# Patient Record
Sex: Female | Born: 1935 | Race: Black or African American | Hispanic: No | Smoking: Never smoker
Health system: Southern US, Community
[De-identification: ages and names within clinical notes are randomized; demographics above are authoritative.]

## PROBLEM LIST (undated history)

## (undated) DIAGNOSIS — E785 Hyperlipidemia, unspecified: Secondary | ICD-10-CM

## (undated) DIAGNOSIS — I10 Essential (primary) hypertension: Secondary | ICD-10-CM

## (undated) DIAGNOSIS — F319 Bipolar disorder, unspecified: Secondary | ICD-10-CM

## (undated) DIAGNOSIS — F0390 Unspecified dementia without behavioral disturbance: Secondary | ICD-10-CM

---

## 2009-11-20 ENCOUNTER — Emergency Department: Payer: Self-pay | Admitting: Internal Medicine

## 2011-03-14 ENCOUNTER — Emergency Department (HOSPITAL_COMMUNITY)
Admission: EM | Admit: 2011-03-14 | Discharge: 2011-03-14 | Disposition: A | Discharge status: Home / Self Care | Payer: BC Managed Care – PPO | Attending: Emergency Medicine | Admitting: Emergency Medicine

## 2011-03-14 DIAGNOSIS — IMO0002 Reserved for concepts with insufficient information to code with codable children: Secondary | ICD-10-CM | POA: Insufficient documentation

## 2011-03-14 DIAGNOSIS — N390 Urinary tract infection, site not specified: Secondary | ICD-10-CM | POA: Insufficient documentation

## 2011-03-14 DIAGNOSIS — F0390 Unspecified dementia without behavioral disturbance: Secondary | ICD-10-CM | POA: Insufficient documentation

## 2011-03-14 DIAGNOSIS — R509 Fever, unspecified: Secondary | ICD-10-CM | POA: Insufficient documentation

## 2011-03-14 LAB — DIFFERENTIAL
Basophils Absolute: 0 10*3/uL (ref 0.0–0.1)
Lymphocytes Relative: 27 % (ref 12–46)
Monocytes Absolute: 1.8 10*3/uL — ABNORMAL HIGH (ref 0.1–1.0)
Neutro Abs: 3.7 10*3/uL (ref 1.7–7.7)

## 2011-03-14 LAB — URINALYSIS, ROUTINE W REFLEX MICROSCOPIC
Bilirubin Urine: NEGATIVE
Nitrite: POSITIVE — AB
pH: 7.5 (ref 5.0–8.0)

## 2011-03-14 LAB — BASIC METABOLIC PANEL
BUN: 7 mg/dL (ref 6–23)
GFR calc non Af Amer: >60 mL/min (ref >60)
Glucose, Bld: 131 mg/dL — ABNORMAL HIGH (ref 70–99)
Potassium: 4.4 mEq/L (ref 3.5–5.1)

## 2011-03-14 LAB — CBC
HCT: 32.9 % — ABNORMAL LOW (ref 36.0–46.0)
Hemoglobin: 11.2 g/dL — ABNORMAL LOW (ref 12.0–15.0)
RBC: 4.06 MIL/uL (ref 3.87–5.11)
WBC: 7.6 10*3/uL (ref 4.0–10.5)

## 2011-03-14 LAB — URINE MICROSCOPIC-ADD ON

## 2011-03-16 LAB — URINE CULTURE
Colony Count: >=100000
Culture  Setup Time: 201205310108

## 2011-04-30 ENCOUNTER — Encounter: Payer: Self-pay | Admitting: *Deleted

## 2011-04-30 ENCOUNTER — Inpatient Hospital Stay (HOSPITAL_COMMUNITY)
Admission: EM | Admit: 2011-04-30 | Discharge: 2011-05-04 | DRG: 690 | Disposition: A | Discharge status: Nursing Home (Includes ICF) | Payer: Medicare (Managed Care) | Attending: Internal Medicine | Admitting: Internal Medicine

## 2011-04-30 ENCOUNTER — Other Ambulatory Visit: Payer: Self-pay

## 2011-04-30 DIAGNOSIS — R627 Adult failure to thrive: Secondary | ICD-10-CM | POA: Diagnosis present

## 2011-04-30 DIAGNOSIS — F319 Bipolar disorder, unspecified: Secondary | ICD-10-CM | POA: Diagnosis present

## 2011-04-30 DIAGNOSIS — E870 Hyperosmolality and hypernatremia: Secondary | ICD-10-CM | POA: Diagnosis present

## 2011-04-30 DIAGNOSIS — R410 Disorientation, unspecified: Secondary | ICD-10-CM | POA: Diagnosis present

## 2011-04-30 DIAGNOSIS — F05 Delirium due to known physiological condition: Secondary | ICD-10-CM | POA: Diagnosis not present

## 2011-04-30 DIAGNOSIS — F0390 Unspecified dementia without behavioral disturbance: Secondary | ICD-10-CM | POA: Diagnosis present

## 2011-04-30 DIAGNOSIS — N390 Urinary tract infection, site not specified: Secondary | ICD-10-CM | POA: Diagnosis present

## 2011-04-30 DIAGNOSIS — E860 Dehydration: Secondary | ICD-10-CM

## 2011-04-30 DIAGNOSIS — I10 Essential (primary) hypertension: Secondary | ICD-10-CM | POA: Diagnosis present

## 2011-04-30 DIAGNOSIS — E876 Hypokalemia: Secondary | ICD-10-CM | POA: Diagnosis not present

## 2011-04-30 DIAGNOSIS — E785 Hyperlipidemia, unspecified: Secondary | ICD-10-CM | POA: Diagnosis present

## 2011-04-30 HISTORY — DX: Bipolar disorder, unspecified: F31.9

## 2011-04-30 HISTORY — DX: Essential (primary) hypertension: I10

## 2011-04-30 HISTORY — DX: Unspecified dementia, unspecified severity, without behavioral disturbance, psychotic disturbance, mood disturbance, and anxiety: F03.90

## 2011-04-30 HISTORY — DX: Hyperlipidemia, unspecified: E78.5

## 2011-04-30 MED ORDER — SODIUM CHLORIDE 0.9 % IV BOLUS (SEPSIS)
750.0000 mL | Freq: Once | INTRAVENOUS | Status: AC
  Administered 2011-05-01: 750 mL via INTRAVENOUS

## 2011-04-30 MED ORDER — SODIUM CHLORIDE 0.9 % IV SOLN: Freq: Once | INTRAVENOUS | Status: DC

## 2011-04-30 NOTE — ED Provider Notes (Addendum)
History     Chief Complaint  Patient presents with  . Fatigue  . Chest Pain    lt. rib pain  . Hematuria   HPI Comments: Per caregiver, pt seen by PCP 3 days ago for fever x 2 days as well as decreased appetite and general weakness. Caregiver also states pt has lost 20lb in past 2 months. Pt started on levaquin 3 days ago for possible UTI. Family noticed hematuria later that day. Pt has been c/o left lower anterior rib pain, but no recent fall or injury. +hoarse but no cough or sob.  Patient is a 77 y.o. female presenting with hematuria. The history is provided by a caregiver and a relative.  Hematuria This is a new problem. The current episode started in the past 7 days. The problem is unchanged. Associated symptoms include fever. Pertinent negatives include no abdominal pain, nausea or vomiting.    Past Medical History  Diagnosis Date  . Dementia   . Bipolar 1 disorder   . Hyperlipemia   . Hypertension     History reviewed. No pertinent past surgical history.  History reviewed. No pertinent family history.  History  Substance Use Topics  . Smoking status: Not on file  . Smokeless tobacco: Not on file  . Alcohol Use: No    OB History    Grav Para Term Preterm Abortions TAB SAB Ect Mult Living                  Review of Systems  Constitutional: Positive for fever.  Gastrointestinal: Negative for nausea, vomiting and abdominal pain.  Genitourinary: Positive for hematuria.  All other systems reviewed and are negative.    Physical Exam  BP 135/48  Pulse 43  Temp(Src) 98.5 F (36.9 C) (Oral)  Resp 14  Ht 5\' 9"  (1.753 m)  Wt 127 lb (57.607 kg)  BMI 18.75 kg/m2  SpO2 97%  Physical Exam  Constitutional: She is oriented to person, place, and time. She appears well-developed and well-nourished.  Non-toxic appearance. She does not appear ill. No distress.  HENT:  Head: Normocephalic and atraumatic.  Right Ear: External ear normal.  Left Ear: External ear  normal.  Nose: Nose normal. No mucosal edema or rhinorrhea.  Mouth/Throat: Mucous membranes are normal.       Pt refuses to open her mouth  Eyes: Conjunctivae and EOM are normal. Pupils are equal, round, and reactive to light.  Neck: Normal range of motion and full passive range of motion without pain. Neck supple.  Cardiovascular: Normal rate, regular rhythm and normal heart sounds.  Exam reveals no gallop and no friction rub.   No murmur heard. Pulmonary/Chest: Effort normal and breath sounds normal. No respiratory distress. She has no wheezes. She has no rhonchi. She has no rales. She exhibits no tenderness and no crepitus.  Abdominal: Soft. Normal appearance and bowel sounds are normal. She exhibits no distension. There is no tenderness. There is no rebound and no guarding.  Musculoskeletal: Normal range of motion. She exhibits no edema and no tenderness.       Moves all extremities well.   Neurological: She is alert and oriented to person, place, and time. She has normal strength. No cranial nerve deficit.  Skin: Skin is warm, dry and intact. No rash noted. No erythema. No pallor.  Psychiatric: Her affect is angry. She is withdrawn.       Pt refuses to talk, glares at me and her family (upset about getting urine  catheter done).    ED Course  Procedures Written by Enos Fling acting as scribe for Dr. Lynelle Doctor. I personally performed the services described in this documentation, which was scribed in my presence. The recorded information has been reviewed and considered. Devoria Albe, MD, FACEP I personally performed the services described in this documentation, which was scribed in my presence. The recorded information has been reviewed and considered.Devoria Albe, MD, FACEP MDM   Date: 05/01/2011  Rate: 90 Rhythm: normal sinus rhythm and premature ventricular contractions (PVC)  QRS Axis: normal  Intervals: normal  ST/T Wave abnormalities: normal  Conduction Disutrbances:none  Narrative  Interpretation:   Old EKG Reviewed: none available  Pt given IV fluids.   Results for orders placed during the hospital encounter of 04/30/11  CARDIAC PANEL(CRET KIN+CKTOT+MB+TROPI)      Component Value Range   Total CK 46  7 - 177 (U/L)   CK, MB 1.2  0.3 - 4.0 (ng/mL)   Troponin I <0.30  <0.30 (ng/mL)   Relative Index RELATIVE INDEX IS INVALID  0.0 - 2.5   CBC      Component Value Range   WBC 10.2  4.0 - 10.5 (K/uL)   RBC 4.18  3.87 - 5.11 (MIL/uL)   Hemoglobin 11.5 (*) 12.0 - 15.0 (g/dL)   HCT 04.5 (*) 40.9 - 46.0 (%)   MCV 82.1  78.0 - 100.0 (fL)   MCH 27.5  26.0 - 34.0 (pg)   MCHC 33.5  30.0 - 36.0 (g/dL)   RDW 81.1  91.4 - 78.2 (%)   Platelets 299  150 - 400 (K/uL)  BASIC METABOLIC PANEL      Component Value Range   Sodium 150 (*) 135 - 145 (mEq/L)   Potassium 3.9  3.5 - 5.1 (mEq/L)   Chloride 110  96 - 112 (mEq/L)   CO2 28  19 - 32 (mEq/L)   Glucose, Bld 165 (*) 70 - 99 (mg/dL)   BUN 21  6 - 23 (mg/dL)   Creatinine, Ser 9.56  0.50 - 1.10 (mg/dL)   Calcium 21.3 (*) 8.4 - 10.5 (mg/dL)   GFR calc non Af Amer >60  >60 (mL/min)   GFR calc Af Amer >60  >60 (mL/min)  URINALYSIS, ROUTINE W REFLEX MICROSCOPIC      Component Value Range   Color, Urine AMBER (*) YELLOW    Appearance HAZY (*) CLEAR    Specific Gravity, Urine >1.030 (*) 1.005 - 1.030    pH 5.5  5.0 - 8.0    Glucose, UA 100 (*) NEGATIVE (mg/dL)   Hgb urine dipstick SMALL (*) NEGATIVE    Bilirubin Urine SMALL (*) NEGATIVE    Ketones, ur NEGATIVE  NEGATIVE (mg/dL)   Protein, ur NEGATIVE  NEGATIVE (mg/dL)   Urobilinogen, UA 1.0  0.0 - 1.0 (mg/dL)   Nitrite NEGATIVE  NEGATIVE    Leukocytes, UA NEGATIVE  NEGATIVE   URINE MICROSCOPIC-ADD ON      Component Value Range   Squamous Epithelial / LPF FEW (*) RARE    WBC, UA 0-2  <3 (WBC/hpf)   RBC / HPF 7-10  <3 (RBC/hpf)   Bacteria, UA MANY (*) RARE    Casts HYALINE CASTS (*) NEGATIVE   Dg Abd Acute W/chest  05/01/2011  *RADIOLOGY REPORT*  Clinical Data:  77 year old female fever, constipation, lethargy.  ACUTE ABDOMEN SERIES (ABDOMEN 2 VIEW & CHEST 1 VIEW)  Comparison: None.  Findings: AP seated upright view the chest.  Low lung volumes. Cardiac size  and mediastinal contours are within normal limits.  No pneumothorax, pulmonary edema, pleural effusion, consolidation or pneumoperitoneum.  Degenerative changes in the spine with scoliosis. Nonobstructed bowel gas pattern. No acute osseous abnormality identified.  IMPRESSION: Nonobstructed bowel gas pattern, no free air. Low lung volumes, otherwise no acute cardiopulmonary abnormality.  Original Report Authenticated By: Harley Hallmark, M.D.           Ward Givens, MD 05/01/11 1610  Ward Givens, MD 05/01/11 [phone]

## 2011-04-30 NOTE — ED Notes (Signed)
Patient with left rib pain, patient has PCP PA on Friday-blood work done and antibiotics started, pt not eating or drinking per caregivier and has become weaker, hoarse voice, fever last week, denies N/V/D, per caregiver pt lost 20 lbs since May 2012, + blood in urine per caregiver

## 2011-05-01 ENCOUNTER — Encounter (HOSPITAL_COMMUNITY): Payer: Self-pay | Admitting: *Deleted

## 2011-05-01 ENCOUNTER — Emergency Department (HOSPITAL_COMMUNITY): Payer: Medicare (Managed Care)

## 2011-05-01 DIAGNOSIS — R627 Adult failure to thrive: Secondary | ICD-10-CM | POA: Diagnosis present

## 2011-05-01 DIAGNOSIS — F0390 Unspecified dementia without behavioral disturbance: Secondary | ICD-10-CM | POA: Diagnosis present

## 2011-05-01 DIAGNOSIS — N390 Urinary tract infection, site not specified: Secondary | ICD-10-CM | POA: Diagnosis present

## 2011-05-01 DIAGNOSIS — E870 Hyperosmolality and hypernatremia: Secondary | ICD-10-CM | POA: Diagnosis present

## 2011-05-01 DIAGNOSIS — E785 Hyperlipidemia, unspecified: Secondary | ICD-10-CM | POA: Diagnosis present

## 2011-05-01 DIAGNOSIS — I10 Essential (primary) hypertension: Secondary | ICD-10-CM | POA: Diagnosis present

## 2011-05-01 DIAGNOSIS — F319 Bipolar disorder, unspecified: Secondary | ICD-10-CM | POA: Diagnosis present

## 2011-05-01 LAB — URINALYSIS, ROUTINE W REFLEX MICROSCOPIC
Glucose, UA: 100 mg/dL — AB
Protein, ur: NEGATIVE mg/dL
pH: 5.5 (ref 5.0–8.0)

## 2011-05-01 LAB — URINE MICROSCOPIC-ADD ON

## 2011-05-01 LAB — COMPREHENSIVE METABOLIC PANEL
ALT: 74 U/L — ABNORMAL HIGH (ref 0–35)
AST: 60 U/L — ABNORMAL HIGH (ref 0–37)
Albumin: 2.7 g/dL — ABNORMAL LOW (ref 3.5–5.2)
CO2: 28 mEq/L (ref 19–32)
Calcium: 9.9 mg/dL (ref 8.4–10.5)
GFR calc non Af Amer: >60 mL/min (ref >60)
Sodium: 148 mEq/L — ABNORMAL HIGH (ref 135–145)

## 2011-05-01 LAB — DIFFERENTIAL
Basophils Absolute: 0 10*3/uL (ref 0.0–0.1)
Basophils Relative: 0 % (ref 0–1)
Eosinophils Relative: 1 % (ref 0–5)
Lymphocytes Relative: 15 % (ref 12–46)
Neutro Abs: 6.2 10*3/uL (ref 1.7–7.7)

## 2011-05-01 LAB — CBC
Hemoglobin: 11.5 g/dL — ABNORMAL LOW (ref 12.0–15.0)
MCH: 27.5 pg (ref 26.0–34.0)
MCV: 82.1 fL (ref 78.0–100.0)
Platelets: 256 10*3/uL (ref 150–400)
RBC: 4.18 MIL/uL (ref 3.87–5.11)
RDW: 15.3 % (ref 11.5–15.5)
WBC: 8 10*3/uL (ref 4.0–10.5)

## 2011-05-01 LAB — MAGNESIUM: Magnesium: 2.2 mg/dL (ref 1.5–2.5)

## 2011-05-01 LAB — BASIC METABOLIC PANEL
CO2: 28 mEq/L (ref 19–32)
Calcium: 10.6 mg/dL — ABNORMAL HIGH (ref 8.4–10.5)
Chloride: 110 mEq/L (ref 96–112)
Creatinine, Ser: 0.79 mg/dL (ref 0.50–1.10)
Glucose, Bld: 165 mg/dL — ABNORMAL HIGH (ref 70–99)
Sodium: 150 mEq/L — ABNORMAL HIGH (ref 135–145)

## 2011-05-01 LAB — MRSA PCR SCREENING: MRSA by PCR: NEGATIVE

## 2011-05-01 MED ORDER — PANTOPRAZOLE SODIUM 40 MG IV SOLR
40.0000 mg | INTRAVENOUS | Status: DC
  Administered 2011-05-01: 40 mg via INTRAVENOUS
  Filled 2011-05-01: qty 40

## 2011-05-01 MED ORDER — ENSURE CLINICAL ST REVIGOR PO LIQD
237.0000 mL | Freq: Three times a day (TID) | ORAL | Status: DC
  Administered 2011-05-01 – 2011-05-03 (×6): 237 mL via ORAL

## 2011-05-01 MED ORDER — SIMVASTATIN 20 MG PO TABS
20.0000 mg | ORAL_TABLET | Freq: Every day | ORAL | Status: DC
  Administered 2011-05-02: 20 mg via ORAL
  Filled 2011-05-01 (×2): qty 1

## 2011-05-01 MED ORDER — ALPRAZOLAM 0.5 MG PO TABS
0.5000 mg | ORAL_TABLET | Freq: Two times a day (BID) | ORAL | Status: DC
  Administered 2011-05-01 – 2011-05-02 (×3): 0.5 mg via ORAL
  Filled 2011-05-01 (×4): qty 1

## 2011-05-01 MED ORDER — CEFTRIAXONE SODIUM 1 G IJ SOLR
1.0000 g | INTRAMUSCULAR | Status: DC
  Filled 2011-05-01 (×2): qty 1

## 2011-05-01 MED ORDER — SODIUM CHLORIDE 0.9 % IJ SOLN
INTRAMUSCULAR | Status: AC
  Administered 2011-05-01: 10 mL
  Filled 2011-05-01: qty 10

## 2011-05-01 MED ORDER — DEXTROSE 5 % IV SOLN
INTRAVENOUS | Status: DC
  Administered 2011-05-01 – 2011-05-03 (×2): via INTRAVENOUS

## 2011-05-01 MED ORDER — CIPROFLOXACIN IN D5W 400 MG/200ML IV SOLN
INTRAVENOUS | Status: AC
  Filled 2011-05-01: qty 200

## 2011-05-01 MED ORDER — MEMANTINE HCL 10 MG PO TABS
10.0000 mg | ORAL_TABLET | Freq: Two times a day (BID) | ORAL | Status: DC
  Administered 2011-05-01 – 2011-05-02 (×3): 10 mg via ORAL
  Filled 2011-05-01 (×4): qty 1

## 2011-05-01 MED ORDER — ENOXAPARIN SODIUM 40 MG/0.4ML ~~LOC~~ SOLN
40.0000 mg | SUBCUTANEOUS | Status: DC
  Administered 2011-05-01 – 2011-05-02 (×2): 40 mg via SUBCUTANEOUS
  Filled 2011-05-01 (×2): qty 0.4

## 2011-05-01 MED ORDER — CIPROFLOXACIN IN D5W 400 MG/200ML IV SOLN
400.0000 mg | Freq: Two times a day (BID) | INTRAVENOUS | Status: DC
  Administered 2011-05-01: 400 mg via INTRAVENOUS
  Filled 2011-05-01: qty 200

## 2011-05-01 MED ORDER — MIRTAZAPINE 30 MG PO TABS
15.0000 mg | ORAL_TABLET | Freq: Every day | ORAL | Status: DC
  Administered 2011-05-02: 15 mg via ORAL
  Filled 2011-05-01 (×2): qty 1

## 2011-05-01 MED ORDER — AMLODIPINE BESYLATE 5 MG PO TABS
5.0000 mg | ORAL_TABLET | Freq: Every day | ORAL | Status: DC
  Administered 2011-05-01 – 2011-05-04 (×4): 5 mg via ORAL
  Filled 2011-05-01 (×4): qty 1

## 2011-05-01 MED ORDER — CEFTRIAXONE SODIUM 1 G IJ SOLR
1.0000 g | INTRAMUSCULAR | Status: DC
  Administered 2011-05-01 – 2011-05-03 (×3): 1 g via INTRAVENOUS
  Filled 2011-05-01 (×4): qty 1

## 2011-05-01 NOTE — Progress Notes (Signed)
Patient refused all 2200 medications. Patient also refuses to keep arm straight for IVF.

## 2011-05-01 NOTE — ED Notes (Addendum)
Note entered under incorrect patient removed

## 2011-05-01 NOTE — ED Notes (Signed)
Patient is resting comfortably. 

## 2011-05-01 NOTE — H&P (Signed)
  [id number] 

## 2011-05-01 NOTE — Progress Notes (Signed)
Subjective: Denies pain, nausea or other.  Objective: Vital signs in last 24 hours: Temp:  [98.5 F (36.9 C)] 98.5 F (36.9 C) (07/17 0459) Pulse Rate:  [43-94] 79  (07/17 0459) Resp:  [14-20] 15  (07/17 0500) BP: (114-143)/(48-77) 142/76 mmHg (07/17 0500) SpO2:  [97 %-98 %] 97 % (07/16 2314) Weight:  [57.6 kg (126 lb 15.8 oz)-57.607 kg (127 lb)] 126 lb 15.8 oz (57.6 kg) (07/17 0459) Weight change:  Last BM Date:  (unknown)  Intake/Output from previous day: 07/16 0701 - 07/17 0700 In: 800 [I.V.:800] Out: 10 [Urine:10] Intake/Output this shift:    General appearance: alert, cooperative, appears stated age, no distress and confused. tangential Head: Normocephalic, without obvious abnormality, atraumatic, moist MM Resp: clear to auscultation bilaterally Cardio: regular rate and rhythm, S1, S2 normal, no murmur, click, rub or gallop GI: soft, non-tender; bowel sounds normal; no masses,  no organomegaly Extremities: extremities normal, atraumatic, no cyanosis or edema  Lab Results:  Basename 05/01/11 0818 04/30/11 2342  WBC 8.0 10.2  HGB 10.7* 11.5*  HCT 32.7* 34.3*  PLT 256 299   BMET  Basename 04/30/11 2342  NA 150*  K 3.9  CL 110  CO2 28  GLUCOSE 165*  BUN 21  CREATININE 0.79  CALCIUM 10.6*    Assessment/Plan: Principal Problem:  *UTI (urinary tract infection): change abx to ceftriaxone Active Problems:  FTT (failure to thrive) in adult:  Add ensure  Hypernatremia: recheck   Dementia:  Appears advanced  Bipolar disorder: stable  HTN (hypertension), benign  Hyperlipemia   LOS: 1 day   Mico Spark L 05/01/2011, 9:31 AM

## 2011-05-01 NOTE — H&P (Signed)
Michelle Escobar, NARON NO.:  [id number]  MEDICAL RECORD NO.:  [id number]  LOCATION:  [facility]                         FACILITY:  [facility]  PHYSICIAN:  Talmage Nap, MD  DATE OF BIRTH:  [birth year]  DATE OF ADMISSION:  04/30/2011 DATE OF DISCHARGE:  LH                             HISTORY & PHYSICAL   PRIMARY CARE PHYSICIAN:  Unknown.  History obtainable mainly from ED physician and partly from the patient (not a very good historian).  CHIEF COMPLAINT:  Refusal of feeds in the nursing home of unspecified duration.  The patient is an 77 year old African American female with history of dementia, bipolar affective disorder and hypertension, who was brought to the emergency room from the nursing home because the patient was said to be refusing the feeds.  There was, however, no documented history of fever.  There was no documented history of chills.  No nausea or vomiting.  No chest pain or shortness of breath.  No abdominal discomfort and no diarrhea or hematochezia.  The refusal of feed was said to be getting persistent and subsequently the patient was brought to the emergency room to be evaluated.  PAST MEDICAL HISTORY: 1. Positive for dementia. 2. Bipolar affective disorder. 3. Hyperlipidemia. 4. Hypertension.  PAST SURGICAL HISTORY:  No known past surgical history.  Her preadmission meds include: 1. Alprazolam 0.5 mg 2 times daily. 2. Norvasc 5 mg p.o. daily. 3. Levaquin 250 mg p.o. daily. 4. Memantine (Namenda 10 mg 2 times daily). 5. Mirtazapine (Remeron 50 mg p.o. daily at bedtime). 6. Simvastatin (Zocor 20 mg p.o. daily at bedtime).  ALLERGIES:  She has no known allergies.  SOCIAL HISTORY:  Negative for alcohol or tobacco use.  She is a resident of the nursing home.  FAMILY HISTORY:  Unknown.  REVIEW OF SYSTEMS:  The patient denies any headaches.  No blurry vision. No nausea or vomiting.  No fever.  No chills.  No rigor.  No chest pain or  shortness of breath.  Complained about mild suprapubic discomfort. No diarrhea or hematochezia.  No dysuria or hematuria.  No swelling of the lower extremity.  No intolerance to heat or cold and no neuropsychiatric disorder.  PHYSICAL EXAMINATION:  GENERAL:  Elderly lady, dehydrated, confused, but not in any respiratory distress at present. VITAL SIGNS:  Temperature is 98.5, pulse is 82, respiratory rate is 14, blood pressure is 132/76 and saturating 97% on room air. HEENT:  Pallor, but pupils are reactive to light and extraocular muscles are intact. NECK:  No jugular venous distention.  No carotid bruit.  No lymphadenopathy. CHEST:  Clear to auscultation.  Heart sounds are 1 and 2. ABDOMEN:  Soft with vague tenderness in the suprapubic region.  Liver, spleen and kidney are not palpable.  Bowel sounds are positive. EXTREMITIES:  No pedal edema. NEUROLOGIC:  Nonfocal. NEUROPSYCHIATRIC EVALUATION:  The patient to be confused. MUSCULOSKELETAL SYSTEM:  Unremarkable. SKIN:  Decreased turgor.  LABORATORY DATA:  Chemistry showed sodium of 150, potassium of 3.9, chloride of 1100, bicarb is 28, BUN is 21, creatinine is 0.79 and glucose is 165.  First set of cardiac markers troponin-I less than 0.30. Hematological indices showed WBC of  10.2, hemoglobin 11.5, hematocrit of 34.3, MCV 82.1 and the platelet count of 299.  Urinalysis showed urine wbc 0-2, rbc 7-10 with many bacteria.  Imaging studies done on the patient include acute abdominal series with chest x-ray and it showed nonobstructive bowel gas pattern.  No free air seen and lung was essentially normal.  PROBLEM LIST: 1. Refusal of feeds. 2. Dehydration. 3. Hypernatremia. 4. Urinary tract infection. 5. Hypercalcemia.  IMPRESSION: 1. Urinary tract infection. 2. Dehydration. 3. Hypernatremia. 4. Hypercalcemia. 5. Dementia. 6. Bipolar affective disorder. 7. Hyperlipidemia. 8. Hypertension.  PLAN:  To admit the patient to  general medical floor.  The patient will be rehydrated with D5W to go at rate of 75 mL an hour.  She will be treated and this will also be used to manage hypernatremia.  UTI.  The patient will be on ciprofloxacin 400 mg IV q.12 h.  Blood pressure will be maintained with amlodipine 5 mg p.o. daily.  The patient will be restarted on her home meds which include; Namenda 10 mg p.o. daily, mirtazapine 50 mg p.o. daily and also Zocor 20 mg p.o. daily.  GI prophylaxis will be done with Protonix 40 mg IV q.24 and DVT prophylaxis with TED stockings as well as Lovenox 40 mg subcu q.24 h.  Further workup to be done on this patient will include intact PTH because of the hypercalcemia.  CBC, CMP and magnesium will be repeated in a.m.  The patient will be followed and evaluated on day-to-day basis.     Talmage Nap, MD     CN/MEDQ  D:  05/01/2011  T:  05/01/2011  Job:  [id number]

## 2011-05-01 NOTE — ED Notes (Signed)
Eyes open, MAE has strong grips.  Visitors say decreased intake for 1 week.  Seen by MD on Friday and lab work done.  MM's dry.

## 2011-05-02 ENCOUNTER — Encounter (HOSPITAL_COMMUNITY): Payer: Self-pay | Admitting: Family Medicine

## 2011-05-02 LAB — BASIC METABOLIC PANEL
BUN: 5 mg/dL — ABNORMAL LOW (ref 6–23)
CO2: 29 mEq/L (ref 19–32)
Chloride: 99 mEq/L (ref 96–112)
Creatinine, Ser: 0.62 mg/dL (ref 0.50–1.10)
GFR calc Af Amer: >60 mL/min (ref >60)
Glucose, Bld: 194 mg/dL — ABNORMAL HIGH (ref 70–99)

## 2011-05-02 LAB — PTH, INTACT AND CALCIUM: PTH: 59 pg/mL (ref 14.0–72.0)

## 2011-05-02 MED ORDER — MIRTAZAPINE 30 MG PO TABS
15.0000 mg | ORAL_TABLET | Freq: Every day | ORAL | Status: DC
  Administered 2011-05-03: 15 mg via ORAL
  Filled 2011-05-02: qty 1

## 2011-05-02 MED ORDER — MEMANTINE HCL 10 MG PO TABS
10.0000 mg | ORAL_TABLET | Freq: Two times a day (BID) | ORAL | Status: DC
  Administered 2011-05-03 – 2011-05-04 (×3): 10 mg via ORAL
  Filled 2011-05-02 (×3): qty 1

## 2011-05-02 MED ORDER — SIMVASTATIN 20 MG PO TABS
20.0000 mg | ORAL_TABLET | Freq: Every day | ORAL | Status: DC
  Administered 2011-05-03: 20 mg via ORAL
  Filled 2011-05-02: qty 1

## 2011-05-02 MED ORDER — ENOXAPARIN SODIUM 40 MG/0.4ML ~~LOC~~ SOLN
40.0000 mg | SUBCUTANEOUS | Status: DC
  Administered 2011-05-03: 40 mg via SUBCUTANEOUS
  Filled 2011-05-02: qty 0.4

## 2011-05-02 MED ORDER — HALOPERIDOL 2 MG PO TABS: 1.0000 mg | ORAL_TABLET | Freq: Four times a day (QID) | ORAL | Status: DC | PRN

## 2011-05-02 MED ORDER — HALOPERIDOL LACTATE 5 MG/ML IJ SOLN
5.0000 mg | Freq: Once | INTRAMUSCULAR | Status: AC
  Administered 2011-05-02: 5 mg via INTRAMUSCULAR
  Filled 2011-05-02: qty 1

## 2011-05-02 MED ORDER — ALPRAZOLAM 0.5 MG PO TABS
0.5000 mg | ORAL_TABLET | Freq: Two times a day (BID) | ORAL | Status: DC
  Administered 2011-05-03 – 2011-05-04 (×3): 0.5 mg via ORAL
  Filled 2011-05-02 (×3): qty 1

## 2011-05-02 MED ORDER — SODIUM CHLORIDE 0.9 % IJ SOLN
INTRAMUSCULAR | Status: AC
  Filled 2011-05-02: qty 3

## 2011-05-02 MED ORDER — HALOPERIDOL LACTATE 5 MG/ML IJ SOLN: 1.0000 mg | Freq: Four times a day (QID) | INTRAMUSCULAR | Status: DC | PRN

## 2011-05-02 MED ORDER — HALOPERIDOL DECANOATE 100 MG/ML IM SOLN
5.0000 mg | Freq: Once | INTRAMUSCULAR | Status: DC
  Filled 2011-05-02: qty 0.05

## 2011-05-02 NOTE — Progress Notes (Signed)
UR Chart Review Completed  

## 2011-05-02 NOTE — Progress Notes (Signed)
Per nursing, became very agitated overnight, getting OOB, tore out IV, refusing meds and refusing to eat. Now sedated after IM Haldol and safety sitter. Refused labs. Now has IV back in  Subjective: Asleep  Objective: Vital signs in last 24 hours: Temp:  [98 F (36.7 C)-98.5 F (36.9 C)] 98.1 F (36.7 C) (07/18 1400) Pulse Rate:  [81-102] 83  (07/18 1400) Resp:  [16-20] 16  (07/18 1400) BP: (138-157)/(74-88) 139/88 mmHg (07/18 1400) SpO2:  [93 %-99 %] 99 % (07/18 1400) Weight change:  Last BM Date: 05/01/11  Intake/Output from previous day: 07/17 0701 - 07/18 0700 In: 600 [I.V.:600] Out: 1 [Urine:1] Intake/Output this shift: I/O this shift: In: 120 [P.O.:120] Out: -   General appearance: sedated, arousable Resp: clear to auscultation bilaterally Cardio: regular rate and rhythm, S1, S2 normal, no murmur, click, rub or gallop GI: soft, non-tender; bowel sounds normal; no masses,  no organomegaly Extremities: extremities normal, atraumatic, no cyanosis or edema  Lab Results:  [facility] 05/01/11 0818 04/30/11 2342  WBC 8.0 10.2  HGB 10.7* 11.5*  HCT 32.7* 34.3*  PLT 256 299   BMET  Basename 05/01/11 0818 04/30/11 2342  NA 148* 150*  K 3.1* 3.9  CL 110 110  CO2 28 28  GLUCOSE 179* 165*  BUN 13 21  CREATININE 0.56 0.79  CALCIUM 9.19.9 10.6*    Assessment/Plan: Principal Problem:  *UTI (urinary tract infection): change abx to ceftriaxone. Culture not done.  Will order Active Problems:  FTT (failure to thrive) in adult:  Add ensure  Hypernatremia: get BMET now while cooperative   Dementia:  Appears advanced  Bipolar disorder: stable  HTN (hypertension), benign  Hyperlipemia Acute Delerium.  Will order PRN haldol   LOS: 2 days   Wilfredo Canterbury L 05/02/2011, 4:47 PM

## 2011-05-03 MED ORDER — POTASSIUM CHLORIDE 2 MEQ/ML IV SOLN
INTRAVENOUS | Status: DC
  Administered 2011-05-03: 13:00:00 via INTRAVENOUS
  Filled 2011-05-03 (×2): qty 1000

## 2011-05-03 NOTE — Progress Notes (Signed)
Patient's daughter Michelle Escobar @ [phone] would like a swallow test performed while she is in the hospital d/t a decrease in her eating habits and difficulty swallowing per family.  Left physicians sticky note on chart

## 2011-05-03 NOTE — Progress Notes (Signed)
Speech Language/Pathology Bedside Swallow Evaluation Patient Name: Michelle Escobar [id number] Date: 05/03/2011 Problem List:  Patient Active Problem List  Diagnoses  . UTI (urinary tract infection)  . Hypernatremia  . FTT (failure to thrive) in adult  . Dementia  . Bipolar disorder  . HTN (hypertension), benign  . Hyperlipemia   Past Medical History:  Past Medical History  Diagnosis Date  . Dementia   . Bipolar 1 disorder   . Hyperlipemia   . Hypertension    Past Surgical History: History reviewed. No pertinent past surgical history. General Information Date of Onset: 05/02/11 Type of Study:  (BSE) Diet Prior to this Study: Regular;Thin liquids Temperature Spikes Noted: No Respiratory Status: Room air Trach Size and Type:  (N/A) Behavior/Cognition: Alert;Uncooperative Patient Positioning: Upright in bed Baseline Vocal Quality: Normal     Consistency Results                 Assessment/Recommendations/Treatment Plan          Recommendations Solid Consistency: Regular Liquid Consistency: Thin Liquid Administration via: Cup Medication Administration: Crushed with puree Postural Changes and/or Swallow Maneuvers: Seated upright 90 degrees Oral Care Recommendations: Staff/trained caregiver to provide oral care Other Recommendations:  (encourage intake, use sweetening to increase intake of foods)     Prognosis Prognosis for Safe Diet Advancement: Good Barriers to Reach Goals: Cognitive deficits;Behavior     Bedside eval attempted x2, patient not cooperative   Spoke with nurse and sitter, report patient took 75% of breakfast meal with no s/s, no cough no change in breathing and good intake. Requested cheeseburger for lunch Poor apetitte appears due to behavior not dysphagia but patient not cooperative for full eval

## 2011-05-03 NOTE — Progress Notes (Signed)
NAMEANTWONETTE, FELIZ NO.:  [id number]  MEDICAL RECORD NO.:  [id number]  LOCATION:  A325                          FACILITY:  [facility]  PHYSICIAN:  Wilson Singer, M.D.DATE OF BIRTH:  05/03/1927  DATE OF PROCEDURE:  05/03/2011 DATE OF DISCHARGE:                                PROGRESS NOTE   This 77 year old demented lady is better today.  She is less agitated and appears to be less delirious than she was yesterday.  She has tolerated some food without any problems.  The daughter requested a swallowing evaluation, which I think is not unreasonable in view of her dementia.  PHYSICAL EXAMINATION:  VITAL SIGNS:  Temperature 97.9, blood pressure 128/57, pulse 99, saturation 93% on room air. GENERAL:  She looks systemically well and still appears to be slightly delirious. HEART:  Sounds are present, normal. LUNG:  Fields are clear. ABDOMEN:  Soft and nontender. NEUROLOGIC:  There are no focal neurological signs.  INVESTIGATIONS:  Sodium 139, potassium slightly low at 3.4, BUN 5, creatinine 0.62.  Her sodium is significantly improved to normal now. It was 150 on admission. Urine culture is not available.  IMPRESSION: 1. Urinary tract infection. 2. Hypernatremia, improved. 3. Dementia, advanced. 4. Bipolar disorder. 5. Hypertension.  PLAN: 1. Reduce intravenous fluids and replete potassium. 2. Speech Language Pathology consultation for swallowing evaluation. 3. Possible discharge soon if the patient continues to be stable.  We     will repeat her lab work tomorrow.    Wilson Singer, M.D.    NCG/MEDQ  D:  05/03/2011  T:  05/03/2011  Job:  [id number]

## 2011-05-04 DIAGNOSIS — R410 Disorientation, unspecified: Secondary | ICD-10-CM | POA: Diagnosis present

## 2011-05-04 LAB — COMPREHENSIVE METABOLIC PANEL
AST: 20 U/L (ref 0–37)
BUN: 3 mg/dL — ABNORMAL LOW (ref 6–23)
CO2: 28 mEq/L (ref 19–32)
Calcium: 9.2 mg/dL (ref 8.4–10.5)
Creatinine, Ser: 0.54 mg/dL (ref 0.50–1.10)
GFR calc Af Amer: >60 mL/min (ref >60)
GFR calc non Af Amer: >60 mL/min (ref >60)
Glucose, Bld: 134 mg/dL — ABNORMAL HIGH (ref 70–99)

## 2011-05-04 LAB — CBC
MCH: 27 pg (ref 26.0–34.0)
MCV: 79.4 fL (ref 78.0–100.0)
Platelets: 238 10*3/uL (ref 150–400)
RBC: 4.03 MIL/uL (ref 3.87–5.11)

## 2011-05-04 MED ORDER — HALOPERIDOL 1 MG PO TABS: 1.0000 mg | ORAL_TABLET | Freq: Four times a day (QID) | ORAL | Status: AC | PRN

## 2011-05-04 MED ORDER — NITROFURANTOIN MONOHYD MACRO 100 MG PO CAPS: 100.0000 mg | ORAL_CAPSULE | Freq: Two times a day (BID) | ORAL | Status: AC

## 2011-05-04 MED ORDER — ENSURE CLINICAL ST REVIGOR PO LIQD: 237.0000 mL | Freq: Three times a day (TID) | ORAL | Status: DC

## 2011-05-04 NOTE — Discharge Summary (Signed)
Physician Discharge Summary  Patient ID: Michelle Escobar MRN: [id number] DOB/AGE: Jan 15, 1927 77 y.o.  Admit date: 04/30/2011 Discharge date: 05/04/2011  Discharge Diagnoses:  Principal Problem:  *UTI (urinary tract infection) Active Problems:  FTT (failure to thrive) in adult  Hypernatremia  Dementia  Bipolar disorder  HTN (hypertension), benign  Hyperlipemia   Discharged Condition: Stable  Hospital Course: The patient is an 77 year old black female who was admitted to the hospital for urinary tract infection dehydration, hypernatremia. She had been failing to thrive at the assisted living facility. She has a history of dementia and bipolar disorder. She is a poor historian. Please see H&P for complete admission details. She has been refusing to eat. In the emergency room, she had normal vital signs. She was confused but cooperative. She had an unremarkable physical examination. The patient was admitted to the hospitalist service. She was given D5W. She was started initially on ciprofloxacin, subsequently switched to ceftriaxone. She will require a few more days of oral antibiotic. Her height her hypernatremia has resolved. She has had no fevers. Her vital signs are stable. She is now eating intermittently. She is stable for discharge. She also had a speech therapy evaluation. She was uncooperative but the speech therapist noted no problems with aspiration based on nursing reports.  The patient also had hypokalemia which was corrected.  She also became acutely delirious, ripped out her IV and became very combative. She was given sedatives and an IV was replaced. She was provided a Recruitment consultant. At this time, she is currently calm and cooperative. She has intermittently refused vital signs, laboratories. She will most likely be more cooperative in a familiar environment and will be transferred back to assisted-living facility. Total time on the day of discharge is greater than 30  minutes.  Consults: Speech Therapy  Significant Diagnostic Studies:  Dg Abd Acute W/chest  05/01/2011  *RADIOLOGY REPORT*  Clinical Data: 77 year old female fever, constipation, lethargy.  ACUTE ABDOMEN SERIES (ABDOMEN 2 VIEW & CHEST 1 VIEW)  Comparison: None.  Findings: AP seated upright view the chest.  Low lung volumes. Cardiac size and mediastinal contours are within normal limits.  No pneumothorax, pulmonary edema, pleural effusion, consolidation or pneumoperitoneum.  Degenerative changes in the spine with scoliosis. Nonobstructed bowel gas pattern. No acute osseous abnormality identified.  IMPRESSION: Nonobstructed bowel gas pattern, no free air. Low lung volumes, otherwise no acute cardiopulmonary abnormality.  Original Report Authenticated By: Harley Hallmark, M.D.   Labs:  Results for Michelle, Escobar (MRN [id number]) as of 05/04/2011 08:42  Ref. Range 05/01/2011 00:23 05/01/2011 01:24 05/01/2011 04:31 05/01/2011 08:18 05/02/2011 18:33  Sodium Latest Range: 135-145 mEq/L    148 (H) 139  Potassium Latest Range: 3.5-5.1 mEq/L    3.1 (L) 3.4 (L)  Chloride Latest Range: 96-112 mEq/L    110 99  CO2 Latest Range: 19-32 mEq/L    28 29  BUN Latest Range: 6-23 mg/dL    13 5 (L)  Creat Latest Range: 0.50-1.10 mg/dL    7.82 9.56  Calcium Latest Range: 8.4-10.5 mg/dL    9.9 9.9  GFR calc non Af Amer Latest Range: >60 mL/min    >60 >60  GFR calc Af Amer Latest Range: >60 mL/min    >60 >60  Glucose Latest Range: 70-99 mg/dL    213 (H) 086 (H)  Calcium, Total (PTH) Latest Range: 8.4-10.5 mg/dL    9.1   Magnesium Latest Range: 1.5-2.5 mg/dL    2.2   Alkaline Phosphatase Latest Range: 39-117  U/L    129 (H)   Albumin Latest Range: 3.5-5.2 g/dL    2.7 (L)   AST Latest Range: 0-37 U/L    60 (H)   ALT Latest Range: 0-35 U/L    74 (H)   Total Protein Latest Range: 6.0-8.3 g/dL    8.5 (H)   Total Bilirubin Latest Range: 0.3-1.2 mg/dL    0.3   WBC Latest Range: 4.0-10.5 K/uL    8.0   RBC Latest Range: 3.87-5.11  MIL/uL    4.00   HGB Latest Range: 12.0-15.0 g/dL    65.7 (L)   HCT Latest Range: 36.0-46.0 %    32.7 (L)   MCV Latest Range: 78.0-100.0 fL    81.8   MCH Latest Range: 26.0-34.0 pg    26.8   MCHC Latest Range: 30.0-36.0 g/dL    84.6   RDW Latest Range: 11.5-15.5 %    15.3   Platelets Latest Range: 150-400 K/uL    256   Neutrophils Relative Latest Range: 43-77 %    78 (H)   Lymphocytes Relative Latest Range: 12-46 %    15   Monocytes Relative Latest Range: 3-12 %    6   Eosinophils Relative Latest Range: 0-5 %    1   Basophils Relative Latest Range: 0-1 %    0   Neutrophils Absolute Latest Range: 1.7-7.7 K/uL    6.2   Lymphocytes Absolute Latest Range: 0.7-4.0 K/uL    1.2   Monocytes Absolute Latest Range: 0.1-1.0 K/uL    0.5   Eosinophils Absolute Latest Range: 0.0-0.7 K/uL    0.1   Basophils Absolute Latest Range: 0.0-0.1 K/uL    0.0   PTH Latest Range: 14.0-72.0 pg/mL    59.0   Color, Urine Latest Range: YELLOW   AMBER (A)     Appearance Latest Range: CLEAR   HAZY (A)     Specific Gravity, Urine Latest Range: 1.005-1.030   >1.030 (H)     pH Latest Range: 5.0-8.0   5.5     Glucose, UA Latest Range: NEGATIVE mg/dL  962 (A)     Bilirubin Urine Latest Range: NEGATIVE   SMALL (A)     Ketones, ur Latest Range: NEGATIVE mg/dL  NEGATIVE     Protein Latest Range: NEGATIVE mg/dL  NEGATIVE     Urobilinogen, UA Latest Range: 0.0-1.0 mg/dL  1.0     Nitrite Latest Range: NEGATIVE   NEGATIVE     Leukocytes, UA Latest Range: NEGATIVE   NEGATIVE     WBC, UA Latest Range: <3 WBC/hpf  0-2     RBC / HPF Latest Range: <3 RBC/hpf  7-10     Squamous Epithelial / LPF Latest Range: RARE   FEW (A)     Bacteria, UA Latest Range: RARE   MANY (A)     Casts Latest Range: NEGATIVE   HYALINE CASTS (A)     MRSA by PCR Latest Range: NEGATIVE    NEGATIVE    Hgb urine dipstick Latest Range: NEGATIVE   SMALL (A)       Discharge Exam: Blood pressure 131/78, pulse 104, temperature 98.3 F (36.8 C), temperature  source Oral, resp. rate 18, height 5\' 9"  (1.753 m), weight 61.2 kg (134 lb 14.7 oz), SpO2 98.00%.  General appearance: awake. Calm and cooperative. Watching TV  Resp: clear to auscultation bilaterally  Cardio: regular rate and rhythm, S1, S2 normal, no murmur, click, rub or gallop  GI: soft, non-tender; bowel  sounds normal; no masses, no organomegaly  Extremities: extremities normal, atraumatic, no cyanosis or edema   Disposition: ALF  Discharge Orders    Future Orders Please Complete By Expires   Diet general      Scheduling Instructions:   Encourage intake, fluids   Increase activity slowly      Discharge instructions      Comments:   Encourage oral intake, especially fluids     Current Discharge Medication List    START taking these medications   Details  feeding supplement (ENSURE CLINICAL STRENGTH) LIQD Take 237 mLs by mouth 3 (three) times daily between meals. Qty: 90 Bottle, Refills: 0    haloperidol (HALDOL) 1 MG tablet Take 1 tablet (1 mg total) by mouth every 6 (six) hours as needed (agitation). Qty: 10 tablet, Refills: 0    nitrofurantoin, macrocrystal-monohydrate, (MACROBID) 100 MG capsule Take 1 capsule (100 mg total) by mouth 2 (two) times daily. Qty: 4 capsule, Refills: 0      CONTINUE these medications which have NOT CHANGED   Details  ALPRAZolam (XANAX) 0.5 MG tablet Take 0.5 mg by mouth 2 (two) times daily.      amLODipine (NORVASC) 5 MG tablet Take 5 mg by mouth daily.      memantine (NAMENDA) 10 MG tablet Take 10 mg by mouth 2 (two) times daily.      mirtazapine (REMERON) 15 MG tablet Take 15 mg by mouth at bedtime.      simvastatin (ZOCOR) 20 MG tablet Take 20 mg by mouth at bedtime.      acetaminophen (TYLENOL) 500 MG tablet Take 500 mg by mouth 3 (three) times daily as needed. For pain/fever     gabapentin (NEURONTIN) 100 MG capsule Take 100 mg by mouth 3 (three) times daily as needed. For anxiety/agression     traZODone (DESYREL) 50 MG tablet  Take 50 mg by mouth at bedtime as needed. For sleep       STOP taking these medications     levofloxacin (LEVAQUIN) 250 MG tablet      levofloxacin (LEVAQUIN) 250 MG tablet        Follow-up Information    Follow up with pcp in 1 week.         SignedChristiane Ha 05/04/2011, 9:02 AM

## 2011-05-04 NOTE — Consult Note (Signed)
Pt d/c today by MD back to [facility].  Facility aware and agreeable and to transport pt.  Pt's daughter notified by voicemail.  Quita Skye, RN, reviewed [facility] for accuracy prior to d/c.    Karn Cassis

## 2011-05-04 NOTE — Progress Notes (Signed)
Per RN, more cooperative. Eating.  Subjective: No complaints  Objective: Vital signs in last 24 hours: Temp:  [98.3 F (36.8 C)] 98.3 F (36.8 C) (07/19 1400) Pulse Rate:  [104] 104  (07/19 1400) Resp:  [18] 18  (07/19 1400) BP: (131)/(78) 131/78 mmHg (07/19 1400) SpO2:  [98 %] 98 % (07/19 1400) Weight:  [61.2 kg (134 lb 14.7 oz)] 134 lb 14.7 oz (61.2 kg) (07/20 0310) Weight change:  Last BM Date: 05/01/11  Intake/Output from previous day: 07/19 0701 - 07/20 0700 In: 1622.3 [P.O.:630; I.V.:992.3] Out: -  Intake/Output this shift:    General appearance: awake.  Calm and cooperative.  Watching TV Resp: clear to auscultation bilaterally Cardio: regular rate and rhythm, S1, S2 normal, no murmur, click, rub or gallop GI: soft, non-tender; bowel sounds normal; no masses,  no organomegaly Extremities: extremities normal, atraumatic, no cyanosis or edema  Lab Results: No results found for this basename: WBC:2,HGB:2,HCT:2,PLT:2 in the last 72 hours BMET  [facility] 05/02/11 1833  NA 139  K 3.4*  CL 99  CO2 29  GLUCOSE 194*  BUN 5*  CREATININE 0.62  CALCIUM 9.9    Assessment/Plan: Principal Problem:  *UTI (urinary tract infection): Cx still not done, but clinically better.  Change to PO abx Active Problems:  FTT (failure to thrive) in adult:  Add ensure. Better.  Hypernatremia: resolved  Dementia:  Appears advanced  Bipolar disorder: stable  HTN (hypertension), benign  Hyperlipemia Acute Delerium. Improved  Discharge to ALF   LOS: 4 days   Kearney Evitt L 05/04/2011, 8:32 AM

## 2011-05-04 NOTE — Progress Notes (Signed)
IV removed, site WNL.  d/c instructions sent with pt.  Discussed home care with patient caregiver and gave pt d/c packet to care giver. F/U appointments in place, pt states they will keep appts.  Caregiver to pick pt up around 1pm.

## 2011-05-04 NOTE — Progress Notes (Signed)
Pt caregiver here, pt dressed and taken to main entrance by staff member with caregiver. Pt stable at time of d/c

## 2013-03-30 ENCOUNTER — Encounter (HOSPITAL_COMMUNITY): Payer: Self-pay

## 2013-03-30 ENCOUNTER — Emergency Department (HOSPITAL_COMMUNITY)
Admission: EM | Admit: 2013-03-30 | Discharge: 2013-03-30 | Disposition: A | Discharge status: Home / Self Care | Payer: Federal, State, Local not specified - PPO | Attending: Emergency Medicine | Admitting: Emergency Medicine

## 2013-03-30 DIAGNOSIS — Z79899 Other long term (current) drug therapy: Secondary | ICD-10-CM | POA: Insufficient documentation

## 2013-03-30 DIAGNOSIS — Z043 Encounter for examination and observation following other accident: Secondary | ICD-10-CM | POA: Insufficient documentation

## 2013-03-30 DIAGNOSIS — Y9289 Other specified places as the place of occurrence of the external cause: Secondary | ICD-10-CM | POA: Insufficient documentation

## 2013-03-30 DIAGNOSIS — I10 Essential (primary) hypertension: Secondary | ICD-10-CM | POA: Insufficient documentation

## 2013-03-30 DIAGNOSIS — W19XXXA Unspecified fall, initial encounter: Secondary | ICD-10-CM

## 2013-03-30 DIAGNOSIS — Y9389 Activity, other specified: Secondary | ICD-10-CM | POA: Insufficient documentation

## 2013-03-30 DIAGNOSIS — R55 Syncope and collapse: Secondary | ICD-10-CM | POA: Insufficient documentation

## 2013-03-30 DIAGNOSIS — F319 Bipolar disorder, unspecified: Secondary | ICD-10-CM | POA: Insufficient documentation

## 2013-03-30 DIAGNOSIS — F0390 Unspecified dementia without behavioral disturbance: Secondary | ICD-10-CM | POA: Insufficient documentation

## 2013-03-30 DIAGNOSIS — R296 Repeated falls: Secondary | ICD-10-CM | POA: Insufficient documentation

## 2013-03-30 DIAGNOSIS — E785 Hyperlipidemia, unspecified: Secondary | ICD-10-CM | POA: Insufficient documentation

## 2013-03-30 LAB — CBC WITH DIFFERENTIAL/PLATELET
Basophils Absolute: 0 10*3/uL (ref 0.0–0.1)
HCT: 37.6 % (ref 36.0–46.0)
Hemoglobin: 13 g/dL (ref 12.0–15.0)
Lymphocytes Relative: 9 % — ABNORMAL LOW (ref 12–46)
Monocytes Absolute: 0.6 10*3/uL (ref 0.1–1.0)
Monocytes Relative: 6 % (ref 3–12)
Neutro Abs: 7.5 10*3/uL (ref 1.7–7.7)
RBC: 4.53 MIL/uL (ref 3.87–5.11)
WBC: 8.9 10*3/uL (ref 4.0–10.5)

## 2013-03-30 LAB — BASIC METABOLIC PANEL WITH GFR
BUN: 8 mg/dL (ref 6–23)
CO2: 29 meq/L (ref 19–32)
Calcium: 9.9 mg/dL (ref 8.4–10.5)
Chloride: 101 meq/L (ref 96–112)
Creatinine, Ser: 0.89 mg/dL (ref 0.50–1.10)
GFR calc Af Amer: 68 mL/min — ABNORMAL LOW
GFR calc non Af Amer: 58 mL/min — ABNORMAL LOW
Glucose, Bld: 133 mg/dL — ABNORMAL HIGH (ref 70–99)
Potassium: 4.5 meq/L (ref 3.5–5.1)
Sodium: 139 meq/L (ref 135–145)

## 2013-03-30 NOTE — ED Notes (Signed)
Pt here from [facility] for evaluation of syncopal episode

## 2013-03-30 NOTE — ED Provider Notes (Signed)
History     This chart was scribed for Donnetta Hutching, MD, MD by Smitty Pluck, ED Scribe. The patient was seen in room APA02/APA02 and the patient's care was started at 12:23 PM.   CSN: [id number]  Arrival date & time 03/30/13  1140      No chief complaint on file.    The history is provided by medical records and a caregiver. No language interpreter was used.  Pt is level 5 caveat due to dementia.  HPI Comments: Michelle Escobar is a 79 y.o. female with hx of dementia, bipolar disorder and HTN who presents to the Emergency Department accompanied by nursing home staff due to possible syncope today. Pt was on toilet having bowel movement and when staff found her she was lying on the floor in the bathroom. They state it took her a couple of minutes to return to baseline. Staff denies fever, chills, nausea, vomiting, diarrhea, weakness, cough, SOB and any other pain.   Past Medical History  Diagnosis Date  . Dementia   . Bipolar 1 disorder   . Hyperlipemia   . Hypertension     History reviewed. No pertinent past surgical history.  No family history on file.  History  Substance Use Topics  . Smoking status: Never Smoker   . Smokeless tobacco: Not on file  . Alcohol Use: No    OB History   Grav Para Term Preterm Abortions TAB SAB Ect Mult Living                  Review of Systems  Unable to perform ROS: Dementia    Allergies  Review of patient's allergies indicates no known allergies.  Home Medications   Current Outpatient Rx  Name  Route  Sig  Dispense  Refill  . acetaminophen (TYLENOL) 500 MG tablet   Oral   Take 500 mg by mouth 3 (three) times daily as needed. For pain/fever          . ALPRAZolam (XANAX) 0.5 MG tablet   Oral   Take 0.25 mg by mouth at bedtime.          Marland Kitchen amLODipine (NORVASC) 5 MG tablet   Oral   Take 5 mg by mouth daily.           Marland Kitchen gabapentin (NEURONTIN) 100 MG capsule   Oral   Take 100 mg by mouth 3 (three) times daily as needed.  For anxiety/agression          . galantamine (RAZADYNE) 4 MG tablet   Oral   Take 4 mg by mouth 2 (two) times daily.         . Memantine HCl ER (NAMENDA XR) 28 MG CP24   Oral   Take 28 mg by mouth daily.         . mirtazapine (REMERON) 30 MG tablet   Oral   Take 30 mg by mouth at bedtime.         . risperiDONE (RISPERDAL) 0.25 MG tablet   Oral   Take 0.25 mg by mouth every 6 (six) hours as needed. Anxiety or agitation         . simvastatin (ZOCOR) 20 MG tablet   Oral   Take 20 mg by mouth at bedtime.           . traZODone (DESYREL) 50 MG tablet   Oral   Take 25-100 mg by mouth 3 (three) times daily. Takes 25mg  in the mornings and at noon.  Takes 100 mg at bedtime.         . feeding supplement (ENSURE CLINICAL STRENGTH) LIQD   Oral   Take 237 mLs by mouth 3 (three) times daily between meals.   90 Bottle   0     BP 98/45  Pulse 55  Temp(Src) 97.7 F (36.5 C)  Resp 21  SpO2 96%  Physical Exam  Nursing note and vitals reviewed. Constitutional: She is oriented to person, place, and time. She appears well-developed and well-nourished.  HENT:  Head: Normocephalic and atraumatic.  Eyes: Conjunctivae and EOM are normal. Pupils are equal, round, and reactive to light.  Neck: Normal range of motion. Neck supple.  Cardiovascular: Normal rate, regular rhythm and normal heart sounds.   Pulmonary/Chest: Effort normal and breath sounds normal.  Abdominal: Soft. Bowel sounds are normal.  Musculoskeletal: Normal range of motion.  Neurological: She is alert and oriented to person, place, and time.  Skin: Skin is warm and dry.  Psychiatric: She has a normal mood and affect.    ED Course  Procedures (including critical care time) DIAGNOSTIC STUDIES: Oxygen Saturation is 96% on room air, adequate by my interpretation.    COORDINATION OF CARE: 12:24 PM Discussed ED treatment with pt's caregiver and they agrees to labs.     Labs Reviewed - No data to  display No results found.   No diagnosis found.   Date: 03/30/2013  Rate: 57  Rhythm: sinus brady  QRS Axis: left  Intervals: normal  ST/T Wave abnormalities: normal  Conduction Disutrbances: none  Narrative Interpretation: unremarkable      MDM  According to staff, patient is back to baseline.  She has dementia but she is very alert. No head or neck trauma. No neuro deficits.      I personally performed the services described in this documentation, which was scribed in my presence. The recorded information has been reviewed and is accurate.    Donnetta Hutching, MD 04/03/13 1135

## 2014-11-30 ENCOUNTER — Emergency Department (HOSPITAL_COMMUNITY)
Admission: EM | Admit: 2014-11-30 | Discharge: 2014-11-30 | Disposition: A | Discharge status: Home / Self Care | Payer: Federal, State, Local not specified - PPO | Attending: Emergency Medicine | Admitting: Emergency Medicine

## 2014-11-30 ENCOUNTER — Encounter (HOSPITAL_COMMUNITY): Payer: Self-pay | Admitting: Emergency Medicine

## 2014-11-30 DIAGNOSIS — Y998 Other external cause status: Secondary | ICD-10-CM | POA: Insufficient documentation

## 2014-11-30 DIAGNOSIS — Z79899 Other long term (current) drug therapy: Secondary | ICD-10-CM | POA: Insufficient documentation

## 2014-11-30 DIAGNOSIS — F0390 Unspecified dementia without behavioral disturbance: Secondary | ICD-10-CM | POA: Diagnosis not present

## 2014-11-30 DIAGNOSIS — F319 Bipolar disorder, unspecified: Secondary | ICD-10-CM | POA: Insufficient documentation

## 2014-11-30 DIAGNOSIS — I10 Essential (primary) hypertension: Secondary | ICD-10-CM | POA: Insufficient documentation

## 2014-11-30 DIAGNOSIS — E785 Hyperlipidemia, unspecified: Secondary | ICD-10-CM | POA: Insufficient documentation

## 2014-11-30 DIAGNOSIS — W19XXXA Unspecified fall, initial encounter: Secondary | ICD-10-CM

## 2014-11-30 DIAGNOSIS — Y9389 Activity, other specified: Secondary | ICD-10-CM | POA: Insufficient documentation

## 2014-11-30 DIAGNOSIS — S0990XA Unspecified injury of head, initial encounter: Secondary | ICD-10-CM | POA: Diagnosis present

## 2014-11-30 DIAGNOSIS — Y9289 Other specified places as the place of occurrence of the external cause: Secondary | ICD-10-CM | POA: Insufficient documentation

## 2014-11-30 DIAGNOSIS — W01198A Fall on same level from slipping, tripping and stumbling with subsequent striking against other object, initial encounter: Secondary | ICD-10-CM | POA: Diagnosis not present

## 2014-11-30 DIAGNOSIS — S0083XA Contusion of other part of head, initial encounter: Secondary | ICD-10-CM

## 2014-11-30 NOTE — ED Provider Notes (Signed)
CSN: [id number]     Arrival date & time 11/30/14  1333 History   First MD Initiated Contact with Patient 11/30/14 1654     Chief Complaint  Patient presents with  . Fall  . Head Injury  . Knee Injury     (Consider location/radiation/quality/duration/timing/severity/associated sxs/prior Treatment) HPI.....Marland Kitchen level V caveat for dementia.   Patient resides at [facility].    She accidentally stumbled today getting out of bed approximately 10 AM and hit her for head. She has a small hematoma on same. No change in behavior. She is ambulatory. Staff states normal conversation and activity.  Past Medical History  Diagnosis Date  . Dementia   . Bipolar 1 disorder   . Hyperlipemia   . Hypertension    History reviewed. No pertinent past surgical history. History reviewed. No pertinent family history. History  Substance Use Topics  . Smoking status: Never Smoker   . Smokeless tobacco: Not on file  . Alcohol Use: No   OB History    No data available     Review of Systems  Unable to perform ROS     Allergies  Review of patient's allergies indicates no known allergies.  Home Medications   Prior to Admission medications   Medication Sig Start Date End Date Taking? Authorizing Provider  acetaminophen (TYLENOL) 500 MG tablet Take 500 mg by mouth 3 (three) times daily as needed. For pain/fever     Historical Provider, MD  ALPRAZolam Prudy Feeler) 0.5 MG tablet Take 0.25 mg by mouth at bedtime.     Historical Provider, MD  amLODipine (NORVASC) 5 MG tablet Take 5 mg by mouth daily.      Historical Provider, MD  feeding supplement (ENSURE CLINICAL STRENGTH) LIQD Take 237 mLs by mouth 3 (three) times daily between meals. 05/04/11   Christiane Ha, MD  gabapentin (NEURONTIN) 100 MG capsule Take 100 mg by mouth 3 (three) times daily as needed. For anxiety/agression     Historical Provider, MD  galantamine (RAZADYNE) 4 MG tablet Take 4 mg by mouth 2 (two) times daily.     Historical Provider, MD  Memantine HCl ER (NAMENDA XR) 28 MG CP24 Take 28 mg by mouth daily.    Historical Provider, MD  mirtazapine (REMERON) 30 MG tablet Take 30 mg by mouth at bedtime.    Historical Provider, MD  risperiDONE (RISPERDAL) 0.25 MG tablet Take 0.25 mg by mouth every 6 (six) hours as needed. Anxiety or agitation    Historical Provider, MD  simvastatin (ZOCOR) 20 MG tablet Take 20 mg by mouth at bedtime.      Historical Provider, MD  traZODone (DESYREL) 50 MG tablet Take 25-100 mg by mouth 3 (three) times daily. Takes  in the mornings and at noon.  Takes 100 mg at bedtime.    Historical Provider, MD   BP 139/71 mmHg  Pulse 88  Temp(Src) 98.9 F (37.2 C) (Oral)  Resp 16  Ht  (1.473 m)  Wt 130 lb (58.968 kg)  BMI 27.18 kg/m2  SpO2 100% Physical Exam  Constitutional: She appears well-developed and well-nourished.  Mental status consistent with dementia  HENT:  Head: Normocephalic.  2 cm hematoma right forehead  Eyes: Conjunctivae and EOM are normal. Pupils are equal, round, and reactive to light.  Neck: Normal range of motion. Neck supple.  Cardiovascular: Normal rate and regular rhythm.   Pulmonary/Chest: Effort normal and breath sounds normal.  Abdominal: Soft. Bowel sounds are normal.  Musculoskeletal: Normal  range of motion.  Neurological: She is alert.  Skin: Skin is warm and dry.  Psychiatric:  Lively, recommended, normal behavior per staff  Nursing note and vitals reviewed.   ED Course  Procedures (including critical care time) Labs Review Labs Reviewed - No data to display  Imaging Review No results found.   EKG Interpretation None      MDM   Final diagnoses:  Fall, initial encounter  Traumatic hematoma of forehead, initial encounter    Patient is ambulatory without bony pain. Her behavior is normal. I discussed the clinical scenario with the staff and the daughter on the telephone.  No imaging recommended at this time.    Donnetta HutchingBrian  Donata Reddick, MD 11/30/14 [phone]

## 2014-11-30 NOTE — Discharge Instructions (Signed)
Follow-up with your primary care doctor or return if worse. °

## 2014-11-30 NOTE — ED Notes (Signed)
Larey SeatFell out of bed at 10 am hitting forehead, right knee.  Pt is confused and has dementia.  [facility]

## 2015-01-09 ENCOUNTER — Emergency Department (HOSPITAL_COMMUNITY)
Admission: EM | Admit: 2015-01-09 | Discharge: 2015-01-09 | Disposition: A | Discharge status: Home / Self Care | Payer: Federal, State, Local not specified - PPO | Attending: Emergency Medicine | Admitting: Emergency Medicine

## 2015-01-09 ENCOUNTER — Encounter (HOSPITAL_COMMUNITY): Payer: Self-pay | Admitting: Emergency Medicine

## 2015-01-09 ENCOUNTER — Emergency Department (HOSPITAL_COMMUNITY): Payer: Federal, State, Local not specified - PPO

## 2015-01-09 DIAGNOSIS — E785 Hyperlipidemia, unspecified: Secondary | ICD-10-CM | POA: Diagnosis not present

## 2015-01-09 DIAGNOSIS — F0390 Unspecified dementia without behavioral disturbance: Secondary | ICD-10-CM | POA: Diagnosis not present

## 2015-01-09 DIAGNOSIS — N390 Urinary tract infection, site not specified: Secondary | ICD-10-CM | POA: Diagnosis not present

## 2015-01-09 DIAGNOSIS — F319 Bipolar disorder, unspecified: Secondary | ICD-10-CM | POA: Diagnosis not present

## 2015-01-09 DIAGNOSIS — Z79899 Other long term (current) drug therapy: Secondary | ICD-10-CM | POA: Diagnosis not present

## 2015-01-09 DIAGNOSIS — R569 Unspecified convulsions: Secondary | ICD-10-CM | POA: Diagnosis not present

## 2015-01-09 DIAGNOSIS — R55 Syncope and collapse: Secondary | ICD-10-CM | POA: Insufficient documentation

## 2015-01-09 DIAGNOSIS — R319 Hematuria, unspecified: Secondary | ICD-10-CM

## 2015-01-09 DIAGNOSIS — I10 Essential (primary) hypertension: Secondary | ICD-10-CM | POA: Diagnosis not present

## 2015-01-09 LAB — COMPREHENSIVE METABOLIC PANEL
ALBUMIN: 3.8 g/dL (ref 3.5–5.2)
ALK PHOS: 79 U/L (ref 39–117)
ALT: 14 U/L (ref 0–35)
AST: 23 U/L (ref 0–37)
Anion gap: 7 (ref 5–15)
BILIRUBIN TOTAL: 0.4 mg/dL (ref 0.3–1.2)
BUN: 10 mg/dL (ref 6–23)
CALCIUM: 9.4 mg/dL (ref 8.4–10.5)
CHLORIDE: 108 mmol/L (ref 96–112)
CO2: 27 mmol/L (ref 19–32)
CREATININE: 0.8 mg/dL (ref 0.50–1.10)
GFR, EST AFRICAN AMERICAN: 76 mL/min — AB (ref >90)
GFR, EST NON AFRICAN AMERICAN: 65 mL/min — AB (ref >90)
GLUCOSE: 116 mg/dL — AB (ref 70–99)
POTASSIUM: 4.2 mmol/L (ref 3.5–5.1)
SODIUM: 142 mmol/L (ref 135–145)
TOTAL PROTEIN: 7.4 g/dL (ref 6.0–8.3)

## 2015-01-09 LAB — URINALYSIS, ROUTINE W REFLEX MICROSCOPIC
Bilirubin Urine: NEGATIVE
GLUCOSE, UA: NEGATIVE mg/dL
Nitrite: NEGATIVE
PH: 6.5 (ref 5.0–8.0)
PROTEIN: 100 mg/dL — AB
Specific Gravity, Urine: 1.025 (ref 1.005–1.030)
Urobilinogen, UA: 1 mg/dL (ref 0.0–1.0)

## 2015-01-09 LAB — CBC WITH DIFFERENTIAL/PLATELET
BASOS ABS: 0 10*3/uL (ref 0.0–0.1)
BASOS PCT: 0 % (ref 0–1)
EOS ABS: 0 10*3/uL (ref 0.0–0.7)
EOS PCT: 0 % (ref 0–5)
HCT: 37.2 % (ref 36.0–46.0)
HEMOGLOBIN: 12.7 g/dL (ref 12.0–15.0)
Lymphocytes Relative: 11 % — ABNORMAL LOW (ref 12–46)
Lymphs Abs: 0.9 10*3/uL (ref 0.7–4.0)
MCH: 29 pg (ref 26.0–34.0)
MCHC: 34.1 g/dL (ref 30.0–36.0)
MCV: 84.9 fL (ref 78.0–100.0)
MONO ABS: 0.5 10*3/uL (ref 0.1–1.0)
MONOS PCT: 6 % (ref 3–12)
NEUTROS ABS: 6.9 10*3/uL (ref 1.7–7.7)
Neutrophils Relative %: 83 % — ABNORMAL HIGH (ref 43–77)
Platelets: 169 10*3/uL (ref 150–400)
RBC: 4.38 MIL/uL (ref 3.87–5.11)
RDW: 13.6 % (ref 11.5–15.5)
WBC: 8.3 10*3/uL (ref 4.0–10.5)

## 2015-01-09 LAB — TROPONIN I: Troponin I: <0.03 ng/mL (ref <0.031)

## 2015-01-09 LAB — CBG MONITORING, ED: GLUCOSE-CAPILLARY: 98 mg/dL (ref 70–99)

## 2015-01-09 LAB — BRAIN NATRIURETIC PEPTIDE: B Natriuretic Peptide: 25 pg/mL (ref 0.0–100.0)

## 2015-01-09 LAB — URINE MICROSCOPIC-ADD ON

## 2015-01-09 MED ORDER — SODIUM CHLORIDE 0.9 % IV SOLN
250.0000 mg | Freq: Once | INTRAVENOUS | Status: AC
  Administered 2015-01-09: 250 mg via INTRAVENOUS
  Filled 2015-01-09: qty 2.5

## 2015-01-09 MED ORDER — CEPHALEXIN 500 MG PO TABS: 500.0000 mg | ORAL_TABLET | Freq: Two times a day (BID) | ORAL | Status: DC

## 2015-01-09 MED ORDER — LEVETIRACETAM 250 MG PO TABS: 250.0000 mg | ORAL_TABLET | Freq: Two times a day (BID) | ORAL | Status: DC

## 2015-01-09 MED ORDER — LEVETIRACETAM 500 MG/5ML IV SOLN
INTRAVENOUS | Status: AC
  Filled 2015-01-09: qty 5

## 2015-01-09 MED ORDER — DEXTROSE 5 % IV SOLN
1.0000 g | Freq: Once | INTRAVENOUS | Status: AC
  Administered 2015-01-09: 1 g via INTRAVENOUS
  Filled 2015-01-09: qty 10

## 2015-01-09 NOTE — ED Provider Notes (Signed)
This chart was scribed for Michelle Maw Meiah Zamudio, DO by Tonye Royalty, ED Scribe. This patient was seen in room APA18/APA18   TIME SEEN: 1:17 PM  CHIEF COMPLAINT: syncope  HPI: Michelle Escobar is a 81 y.o. female with history of dementia, hypertension, hyperlipidemia who presents to the Emergency Department complaining of syncope at church this morning. Per caregiver who witnessed the event, she was sitting down when she passed out. She states the patient was not complaining of anything before the event. She states the patient had urinary incontinence during the syncopal episode but did not witness any seizure like activity. Questionable history of previous seizure but not on antiepileptics. Caretaker states EMS reported she had low pressure, unsure about blood sugar. She lives at a family care home. She states she feels "pretty good" at this time; she denies headache, chest pain, or pain elsewhere. Caretaker states she has been acting normally.   ROS: Level 5 caveat: Dementia  PAST MEDICAL HISTORY/PAST SURGICAL HISTORY:  Past Medical History  Diagnosis Date  . Dementia   . Bipolar 1 disorder   . Hyperlipemia   . Hypertension     MEDICATIONS:  Prior to Admission medications   Medication Sig Start Date End Date Taking? Authorizing Provider  acetaminophen (TYLENOL) 500 MG tablet Take 500 mg by mouth 3 (three) times daily as needed. For pain/fever     Historical Provider, MD  ALPRAZolam Prudy Feeler) 0.5 MG tablet Take 0.25 mg by mouth at bedtime.     Historical Provider, MD  amLODipine (NORVASC) 5 MG tablet Take 5 mg by mouth daily.      Historical Provider, MD  feeding supplement (ENSURE CLINICAL STRENGTH) LIQD Take 237 mLs by mouth 3 (three) times daily between meals. 05/04/11   Christiane Ha, MD  gabapentin (NEURONTIN) 100 MG capsule Take 100 mg by mouth 3 (three) times daily as needed. For anxiety/agression     Historical Provider, MD  galantamine (RAZADYNE) 4 MG tablet Take 4 mg by mouth 2 (two)  times daily.    Historical Provider, MD  Memantine HCl ER (NAMENDA XR) 28 MG CP24 Take 28 mg by mouth daily.    Historical Provider, MD  mirtazapine (REMERON) 30 MG tablet Take 30 mg by mouth at bedtime.    Historical Provider, MD  risperiDONE (RISPERDAL) 0.25 MG tablet Take 0.25 mg by mouth every 6 (six) hours as needed. Anxiety or agitation    Historical Provider, MD  simvastatin (ZOCOR) 20 MG tablet Take 20 mg by mouth at bedtime.      Historical Provider, MD  traZODone (DESYREL) 50 MG tablet Take 25-100 mg by mouth 3 (three) times daily. Takes  in the mornings and at noon.  Takes 100 mg at bedtime.    Historical Provider, MD    ALLERGIES:  No Known Allergies  SOCIAL HISTORY:  History  Substance Use Topics  . Smoking status: Never Smoker   . Smokeless tobacco: Not on file  . Alcohol Use: No    FAMILY HISTORY: No family history on file.  EXAM: BP 100/60 mmHg  Pulse 57  Temp(Src) 97.6 F (36.4 C) (Oral)  Resp 16  Wt 130 lb (58.968 kg)  SpO2 95% CONSTITUTIONAL: Alert and oriented to person and place but not time;  Well-appearing; well-nourished, elderly, no distress, no complaints, non toxic HEAD: Normocephalic EYES: Conjunctivae clear, PERRL ENT: normal nose; no rhinorrhea; moist mucous membranes; pharynx without lesions noted NECK: Supple, no meningismus, no LAD  CARD: RRR; S1 and S2 appreciated;  no murmurs, no clicks, no rubs, no gallops RESP: Normal chest excursion without splinting or tachypnea; breath sounds clear and equal bilaterally; no wheezes, no rhonchi, no rales, no hypoxia or respiratory distress ABD/GI: Normal bowel sounds; non-distended; soft, non-tender, no rebound, no guarding BACK:  The back appears normal and is non-tender to palpation, there is no CVA tenderness, no midline spinal tenderness or step-off or deformity EXT: Normal ROM in all joints; non-tender to palpation; no edema; normal capillary refill; no cyanosis    SKIN: Normal color for age and  race; warm NEURO: Patient uncooperative, but no facial droop or slurred speech, opens eyes spontaneously, moves all extremities,  PSYCH: The patient's mood and manner are appropriate. Grooming and personal hygiene are appropriate.  MEDICAL DECISION MAKING: Patient here with syncopal event while sitting down at church. She has a history of dementia and unable to provide any history. Caregiver that was with patient at [employer] states the patient was sitting when the episode happened. States she did complain of some abdominal pain afterwards but has no complaints now. Hemodynamically stable. Appears to be at her neurologic baseline per caregiver at bedside [facility] [phone]). Will obtain labs, urine, chest x-ray, head CT, EKG. We'll check orthostatic vital signs and give IV fluids. We'll discuss with patient's daughter who is also her power of attorney [name] [phone]).   ED PROGRESS: Discussed with patient's daughter [name]. She reports the patient has had a history of epilepsy for many years where she will stare off into space and appear to pass out. Caregivers do report that patient seemed confused for several minutes after and she did have an episode of urinary incontinence. Her daughter feels that this was consistent with her prior seizures. States her last seizure was in 2010 and she is not on antiepileptics. She is comfortable with plan for workup in the emergency department and discharge home if workup is negative.    3:10 PM  Pt's labs unremarkable. Troponin negative. Urine shows large hemoglobin, leukocytes and bacteria. Will treat with ceftriaxone and send urine culture.   Head CT shows dilated ventricular system with no old for comparison but family and caregiver report patient is at her baseline. No ataxia. Doubt normal pressure hydrocephalus. Chest x-ray shows no acute cardiopulmonary disease. Will repeat second troponin 3 hours after the first was drawn. If negative will discharge home  with outpatient follow-up with her primary care provider and a neurologist given she likely had a seizure today.   4:39 PM  Pt possibly had another seizure where she was staring off into space without complete loss of consciousness and appeared confused for several minutes and is now swatting at nursing staff. No tonic-clonic movement. Hemodynamically stable. Briefly bradycardic during this episode but is now back to the 60s. No hypotension. Will discuss with neurology on call.  5:15 PM  D/w Dr. Gerilyn Pilgrim with neurology who recommends starting the patient on Keppra 250 mg twice a day. We'll give first dose in the ED. He will follow-up the patient as outpatient for an outpatient EEG.   6:00 PM  Pt still doing well.  Receive IV Keppra. Troponin 2 negative. Updated patient's daughter who is comfortable with plan for discharge and have advised her to follow-up with Dr. Gerilyn Pilgrim for outpatient EEG.  She is comfortable with this plan. Discussed return precautions.   EKG Interpretation  Date/Time:  Sunday January 09 2015 12:40:10 EDT Ventricular Rate:  56 PR Interval:  196 QRS Duration: 89 QT Interval:  464 QTC  Calculation: 448 R Axis:   -30 Text Interpretation:  Sinus rhythm Inferior infarct, old No significant change since last tracing Confirmed by ZACKOWSKI  MD, SCOTT [phone] on 01/09/2015 12:44:52 PM        I personally performed the services described in this documentation, which was scribed in my presence. The recorded information has been reviewed and is accurate.   Michelle MawKristen N Ysidra Sopher, DO 01/09/15 [phone]

## 2015-01-09 NOTE — Discharge Instructions (Signed)
Seizure, Adult °A seizure is abnormal electrical activity in the brain. Seizures usually last from 30 seconds to 2 minutes. There are various types of seizures. °Before a seizure, you may have a warning sensation (aura) that a seizure is about to occur. An aura may include the following symptoms:  °· Fear or anxiety. °· Nausea. °· Feeling like the room is spinning (vertigo). °· Vision changes, such as seeing flashing lights or spots. °Common symptoms during a seizure include: °· A change in attention or behavior (altered mental status). °· Convulsions with rhythmic jerking movements. °· Drooling. °· Rapid eye movements. °· Grunting. °· Loss of bladder and bowel control. °· Bitter taste in the mouth. °· Tongue biting. °After a seizure, you may feel confused and sleepy. You may also have an injury resulting from convulsions during the seizure. °HOME CARE INSTRUCTIONS  °· If you are given medicines, take them exactly as prescribed by your health care provider. °· Keep all follow-up appointments as directed by your health care provider. °· Do not swim or drive or engage in risky activity during which a seizure could cause further injury to you or others until your health care provider says it is OK. °· Get adequate rest. °· Teach friends and family what to do if you have a seizure. They should: °· Lay you on the ground to prevent a fall. °· Put a cushion under your head. °· Loosen any tight clothing around your neck. °· Turn you on your side. If vomiting occurs, this helps keep your airway clear. °· Stay with you until you recover. °· Know whether or not you need emergency care. °SEEK IMMEDIATE MEDICAL CARE IF: °· The seizure lasts longer than 5 minutes. °· The seizure is severe or you do not wake up immediately after the seizure. °· You have an altered mental status after the seizure. °· You are having more frequent or worsening seizures. °Someone should drive you to the emergency department or call local emergency  services (911 in [place] °MAKE SURE YOU: °· Understand these instructions. °· Will watch your condition. °· Will get help right away if you are not doing well or get worse. °Document Released: 09/28/2000 Document Revised: 07/22/2013 Document Reviewed: 05/13/2013 °ExitCare® Patient Information ©2015 [employer]. This information is not intended to replace advice given to you by your health care provider. Make sure you discuss any questions you have with your health care provider. ° °Urinary Tract Infection °Urinary tract infections (UTIs) can develop anywhere along your urinary tract. Your urinary tract is your body's drainage system for removing wastes and extra water. Your urinary tract includes two kidneys, two ureters, a bladder, and a urethra. Your kidneys are a pair of bean-shaped organs. Each kidney is about the size of your fist. They are located below your ribs, one on each side of your spine. °CAUSES °Infections are caused by microbes, which are microscopic organisms, including fungi, viruses, and bacteria. These organisms are so small that they can only be seen through a microscope. Bacteria are the microbes that most commonly cause UTIs. °SYMPTOMS  °Symptoms of UTIs may vary by age and gender of the patient and by the location of the infection. Symptoms in young women typically include a frequent and intense urge to urinate and a painful, burning feeling in the bladder or urethra during urination. Older women and men are more likely to be tired, shaky, and weak and have muscle aches and abdominal pain. A fever may mean the infection is in   your kidneys. Other symptoms of a kidney infection include pain in your back or sides below the ribs, nausea, and vomiting. °DIAGNOSIS °To diagnose a UTI, your caregiver will ask you about your symptoms. Your caregiver also will ask to provide a urine sample. The urine sample will be tested for bacteria and white blood cells. White blood cells are made by your body to  help fight infection. °TREATMENT  °Typically, UTIs can be treated with medication. Because most UTIs are caused by a bacterial infection, they usually can be treated with the use of antibiotics. The choice of antibiotic and length of treatment depend on your symptoms and the type of bacteria causing your infection. °HOME CARE INSTRUCTIONS °· If you were prescribed antibiotics, take them exactly as your caregiver instructs you. Finish the medication even if you feel better after you have only taken some of the medication. °· Drink enough water and fluids to keep your urine clear or pale yellow. °· Avoid caffeine, tea, and carbonated beverages. They tend to irritate your bladder. °· Empty your bladder often. Avoid holding urine for long periods of time. °· Empty your bladder before and after sexual intercourse. °· After a bowel movement, women should cleanse from front to back. Use each tissue only once. °SEEK MEDICAL CARE IF:  °· You have back pain. °· You develop a fever. °· Your symptoms do not begin to resolve within 3 days. °SEEK IMMEDIATE MEDICAL CARE IF:  °· You have severe back pain or lower abdominal pain. °· You develop chills. °· You have nausea or vomiting. °· You have continued burning or discomfort with urination. °MAKE SURE YOU:  °· Understand these instructions. °· Will watch your condition. °· Will get help right away if you are not doing well or get worse. °Document Released: 07/11/2005 Document Revised: 04/01/2012 Document Reviewed: 11/09/2011 °ExitCare® Patient Information ©2015 [employer]. This information is not intended to replace advice given to you by your health care provider. Make sure you discuss any questions you have with your health care provider. ° °

## 2015-01-09 NOTE — ED Notes (Signed)
AC contacted to obtain IVPB Keppra, medication not stocked in ED pyxis.

## 2015-01-09 NOTE — ED Notes (Signed)
Pt brought in by EMS, states they picked her up from church after being called out for a syncopal episode. Pt awake and alert here. Pt disordered to place and time, oriented to person. Per EMS pt has hx of alzheimer's  

## 2015-01-09 NOTE — ED Notes (Signed)
When to pt's room to check on her, pt HR dropped to 30's when I went to see pt she had a blank stare on her face and did not respond to voice or physical stimuli, after about 20-30 sec as HR returned to normal pt began pushing my hands away. Dr Elesa MassedWard notified. Dr Elesa MassedWard spoke with pt's daughter earlier who described pt having episodes like this that had been dx as seizures in the past.

## 2015-01-10 ENCOUNTER — Telehealth: Payer: Self-pay | Admitting: *Deleted

## 2015-01-10 NOTE — Telephone Encounter (Signed)
Pharmacy called to change pills to liquid.

## 2015-01-11 LAB — URINE CULTURE: Colony Count: >=100000

## 2015-01-12 ENCOUNTER — Telehealth (HOSPITAL_BASED_OUTPATIENT_CLINIC_OR_DEPARTMENT_OTHER): Payer: Self-pay | Admitting: Emergency Medicine

## 2015-01-12 NOTE — Telephone Encounter (Signed)
Post ED Visit - Positive Culture Follow-up  Culture report reviewed by antimicrobial stewardship pharmacist: []  Wes Dulaney, Pharm.D., BCPS []  Celedonio MiyamotoJeremy Frens, [address] BCPS []  Georgina PillionElizabeth Martin, [address] BCPS []  SaratogaMinh Pham, [address] BCPS, AAHIVP [x]  Estella HuskMichelle Turner, Pharm.D., BCPS, AAHIVP []  Elder CyphersLorie Poole, [address] BCPS  Positive urine culture E. Coli Treated with cephalexin, organism sensitive to the same and no further patient follow-up is required at this time.  Berle MullMiller, Shontia Gillooly 01/12/2015, 12:36 PM

## 2015-03-02 ENCOUNTER — Encounter (HOSPITAL_COMMUNITY): Payer: Self-pay | Admitting: *Deleted

## 2015-03-02 ENCOUNTER — Inpatient Hospital Stay (HOSPITAL_COMMUNITY)
Admission: EM | Admit: 2015-03-02 | Discharge: 2015-03-05 | DRG: 689 | Disposition: A | Discharge status: Other Acute Inpatient Hospital | Payer: Medicare (Managed Care) | Attending: Internal Medicine | Admitting: Internal Medicine

## 2015-03-02 ENCOUNTER — Emergency Department (HOSPITAL_COMMUNITY): Payer: Medicare (Managed Care)

## 2015-03-02 DIAGNOSIS — F319 Bipolar disorder, unspecified: Secondary | ICD-10-CM | POA: Diagnosis present

## 2015-03-02 DIAGNOSIS — G40909 Epilepsy, unspecified, not intractable, without status epilepticus: Secondary | ICD-10-CM

## 2015-03-02 DIAGNOSIS — Z66 Do not resuscitate: Secondary | ICD-10-CM | POA: Diagnosis present

## 2015-03-02 DIAGNOSIS — N3000 Acute cystitis without hematuria: Secondary | ICD-10-CM | POA: Diagnosis present

## 2015-03-02 DIAGNOSIS — R197 Diarrhea, unspecified: Secondary | ICD-10-CM | POA: Diagnosis not present

## 2015-03-02 DIAGNOSIS — E785 Hyperlipidemia, unspecified: Secondary | ICD-10-CM | POA: Diagnosis present

## 2015-03-02 DIAGNOSIS — I10 Essential (primary) hypertension: Secondary | ICD-10-CM | POA: Diagnosis present

## 2015-03-02 DIAGNOSIS — F0390 Unspecified dementia without behavioral disturbance: Secondary | ICD-10-CM | POA: Diagnosis present

## 2015-03-02 DIAGNOSIS — G9341 Metabolic encephalopathy: Secondary | ICD-10-CM | POA: Diagnosis not present

## 2015-03-02 DIAGNOSIS — Z23 Encounter for immunization: Secondary | ICD-10-CM

## 2015-03-02 DIAGNOSIS — N390 Urinary tract infection, site not specified: Secondary | ICD-10-CM | POA: Diagnosis present

## 2015-03-02 DIAGNOSIS — G309 Alzheimer's disease, unspecified: Secondary | ICD-10-CM | POA: Diagnosis present

## 2015-03-02 DIAGNOSIS — F0280 Dementia in other diseases classified elsewhere without behavioral disturbance: Secondary | ICD-10-CM | POA: Diagnosis present

## 2015-03-02 DIAGNOSIS — B9620 Unspecified Escherichia coli [E. coli] as the cause of diseases classified elsewhere: Secondary | ICD-10-CM | POA: Diagnosis present

## 2015-03-02 DIAGNOSIS — R4182 Altered mental status, unspecified: Secondary | ICD-10-CM | POA: Diagnosis present

## 2015-03-02 LAB — CBC WITH DIFFERENTIAL/PLATELET
BASOS PCT: 0 % (ref 0–1)
Basophils Absolute: 0 10*3/uL (ref 0.0–0.1)
EOS PCT: 0 % (ref 0–5)
Eosinophils Absolute: 0 10*3/uL (ref 0.0–0.7)
HCT: 36.2 % (ref 36.0–46.0)
HEMOGLOBIN: 12.1 g/dL (ref 12.0–15.0)
LYMPHS ABS: 1.9 10*3/uL (ref 0.7–4.0)
Lymphocytes Relative: 25 % (ref 12–46)
MCH: 28.5 pg (ref 26.0–34.0)
MCHC: 33.4 g/dL (ref 30.0–36.0)
MCV: 85.2 fL (ref 78.0–100.0)
MONOS PCT: 12 % (ref 3–12)
Monocytes Absolute: 0.9 10*3/uL (ref 0.1–1.0)
NEUTROS ABS: 4.7 10*3/uL (ref 1.7–7.7)
Neutrophils Relative %: 63 % (ref 43–77)
Platelets: 162 10*3/uL (ref 150–400)
RBC: 4.25 MIL/uL (ref 3.87–5.11)
RDW: 13.9 % (ref 11.5–15.5)
WBC: 7.5 10*3/uL (ref 4.0–10.5)

## 2015-03-02 LAB — COMPREHENSIVE METABOLIC PANEL
ALT: 17 U/L (ref 14–54)
AST: 27 U/L (ref 15–41)
Albumin: 3.5 g/dL (ref 3.5–5.0)
Alkaline Phosphatase: 90 U/L (ref 38–126)
Anion gap: 7 (ref 5–15)
BUN: 15 mg/dL (ref 6–20)
CHLORIDE: 107 mmol/L (ref 101–111)
CO2: 31 mmol/L (ref 22–32)
CREATININE: 0.89 mg/dL (ref 0.44–1.00)
Calcium: 9.6 mg/dL (ref 8.9–10.3)
GFR calc Af Amer: >60 mL/min (ref >60)
GFR, EST NON AFRICAN AMERICAN: 57 mL/min — AB (ref >60)
GLUCOSE: 138 mg/dL — AB (ref 65–99)
Potassium: 3.9 mmol/L (ref 3.5–5.1)
Sodium: 145 mmol/L (ref 135–145)
Total Bilirubin: 0.5 mg/dL (ref 0.3–1.2)
Total Protein: 7 g/dL (ref 6.5–8.1)

## 2015-03-02 LAB — URINALYSIS, ROUTINE W REFLEX MICROSCOPIC
Bilirubin Urine: NEGATIVE
GLUCOSE, UA: NEGATIVE mg/dL
Ketones, ur: NEGATIVE mg/dL
Nitrite: POSITIVE — AB
PROTEIN: 30 mg/dL — AB
Urobilinogen, UA: 0.2 mg/dL (ref 0.0–1.0)
pH: 5.5 (ref 5.0–8.0)

## 2015-03-02 LAB — URINE MICROSCOPIC-ADD ON

## 2015-03-02 MED ORDER — FLUTICASONE PROPIONATE 0.005 % EX OINT
1.0000 "application " | TOPICAL_OINTMENT | Freq: Two times a day (BID) | CUTANEOUS | Status: DC
  Administered 2015-03-02 – 2015-03-05 (×3): 1 via TOPICAL
  Filled 2015-03-02: qty 30

## 2015-03-02 MED ORDER — ADULT MULTIVITAMIN W/MINERALS CH
1.0000 | ORAL_TABLET | Freq: Every day | ORAL | Status: DC
  Administered 2015-03-02 – 2015-03-05 (×4): 1 via ORAL
  Filled 2015-03-02 (×4): qty 1

## 2015-03-02 MED ORDER — CENTRUM PO CHEW: 1.0000 | CHEWABLE_TABLET | Freq: Every day | ORAL | Status: DC

## 2015-03-02 MED ORDER — DEXTROSE 5 % IV SOLN
1.0000 g | Freq: Once | INTRAVENOUS | Status: AC
  Administered 2015-03-02: 1 g via INTRAVENOUS
  Filled 2015-03-02: qty 10

## 2015-03-02 MED ORDER — RISPERIDONE 0.5 MG PO TABS
0.2500 mg | ORAL_TABLET | Freq: Every day | ORAL | Status: DC
  Administered 2015-03-02 – 2015-03-04 (×3): 0.25 mg via ORAL
  Filled 2015-03-02 (×3): qty 1

## 2015-03-02 MED ORDER — TRAZODONE HCL 50 MG PO TABS
50.0000 mg | ORAL_TABLET | Freq: Every day | ORAL | Status: DC
  Administered 2015-03-02 – 2015-03-03 (×2): 50 mg via ORAL
  Filled 2015-03-02 (×2): qty 1

## 2015-03-02 MED ORDER — ONDANSETRON HCL 4 MG/2ML IJ SOLN: 4.0000 mg | Freq: Four times a day (QID) | INTRAMUSCULAR | Status: DC | PRN

## 2015-03-02 MED ORDER — GALANTAMINE HYDROBROMIDE 4 MG PO TABS
4.0000 mg | ORAL_TABLET | Freq: Two times a day (BID) | ORAL | Status: DC
  Administered 2015-03-02 – 2015-03-05 (×5): 4 mg via ORAL
  Filled 2015-03-02 (×11): qty 1

## 2015-03-02 MED ORDER — SIMVASTATIN 20 MG PO TABS
20.0000 mg | ORAL_TABLET | Freq: Every day | ORAL | Status: DC
  Administered 2015-03-02 – 2015-03-04 (×3): 20 mg via ORAL
  Filled 2015-03-02 (×3): qty 1

## 2015-03-02 MED ORDER — DARIFENACIN HYDROBROMIDE ER 7.5 MG PO TB24
7.5000 mg | ORAL_TABLET | Freq: Every day | ORAL | Status: DC
  Administered 2015-03-02 – 2015-03-05 (×4): 7.5 mg via ORAL
  Filled 2015-03-02 (×4): qty 1

## 2015-03-02 MED ORDER — SODIUM CHLORIDE 0.9 % IV SOLN
INTRAVENOUS | Status: DC
  Administered 2015-03-02 – 2015-03-05 (×4): via INTRAVENOUS

## 2015-03-02 MED ORDER — PNEUMOCOCCAL VAC POLYVALENT 25 MCG/0.5ML IJ INJ
0.5000 mL | INJECTION | INTRAMUSCULAR | Status: AC
  Administered 2015-03-03: 0.5 mL via INTRAMUSCULAR
  Filled 2015-03-02: qty 0.5

## 2015-03-02 MED ORDER — GABAPENTIN 300 MG PO CAPS
300.0000 mg | ORAL_CAPSULE | Freq: Two times a day (BID) | ORAL | Status: DC
  Administered 2015-03-02 – 2015-03-04 (×5): 300 mg via ORAL
  Filled 2015-03-02 (×5): qty 1

## 2015-03-02 MED ORDER — PHENYTOIN 125 MG/5ML PO SUSP
300.0000 mg | Freq: Every day | ORAL | Status: DC
  Administered 2015-03-02 – 2015-03-04 (×3): 300 mg via ORAL
  Filled 2015-03-02 (×6): qty 12

## 2015-03-02 MED ORDER — MIRTAZAPINE 15 MG PO TABS
15.0000 mg | ORAL_TABLET | Freq: Every day | ORAL | Status: DC
  Administered 2015-03-02 – 2015-03-04 (×3): 15 mg via ORAL
  Filled 2015-03-02 (×4): qty 1

## 2015-03-02 MED ORDER — ACETAMINOPHEN 500 MG PO TABS: 500.0000 mg | ORAL_TABLET | Freq: Three times a day (TID) | ORAL | Status: DC | PRN

## 2015-03-02 MED ORDER — ONDANSETRON HCL 4 MG PO TABS: 4.0000 mg | ORAL_TABLET | Freq: Four times a day (QID) | ORAL | Status: DC | PRN

## 2015-03-02 MED ORDER — SERTRALINE HCL 50 MG PO TABS
50.0000 mg | ORAL_TABLET | Freq: Every day | ORAL | Status: DC
  Administered 2015-03-02 – 2015-03-05 (×4): 50 mg via ORAL
  Filled 2015-03-02 (×5): qty 1

## 2015-03-02 MED ORDER — HEPARIN SODIUM (PORCINE) 5000 UNIT/ML IJ SOLN
5000.0000 [IU] | Freq: Three times a day (TID) | INTRAMUSCULAR | Status: DC
  Administered 2015-03-02 – 2015-03-04 (×7): 5000 [IU] via SUBCUTANEOUS
  Filled 2015-03-02 (×8): qty 1

## 2015-03-02 MED ORDER — AMLODIPINE BESYLATE 5 MG PO TABS
5.0000 mg | ORAL_TABLET | Freq: Every day | ORAL | Status: DC
  Administered 2015-03-02 – 2015-03-05 (×4): 5 mg via ORAL
  Filled 2015-03-02 (×4): qty 1

## 2015-03-02 MED ORDER — DEXTROSE 5 % IV SOLN
1.0000 g | INTRAVENOUS | Status: DC
  Administered 2015-03-03 – 2015-03-05 (×3): 1 g via INTRAVENOUS
  Filled 2015-03-02 (×5): qty 10

## 2015-03-02 NOTE — ED Notes (Signed)
Pt comes from [facility] with fever and altered mental status starting yesterday. Per EMS, facility states that "she isn't acting herself." Pt has hx UTI. VS stable. Pt is arousable to voice.

## 2015-03-02 NOTE — ED Provider Notes (Signed)
CSN: [id number]     Arrival date & time 03/02/15  1041 History  This chart was scribed for Benjiman CoreNathan Jayleen Scaglione, MD by Tanda RockersMargaux Venter, ED Scribe. This patient was seen in room APA08/APA08 and the patient's care was started at 11:18 AM.    Chief Complaint  Patient presents with  . Altered Mental Status   LEVEL 5 CAVEAT for altered mental status  The history is provided by the patient and a relative. No language interpreter was used.     HPI Comments: Michelle Escobar is a 81 y.o. female brought in by ambulance, with hx dementia who presents to the Emergency Department for altered mental status x 1 day. [facility] living [facility] with fever and AMS. Facility told EMS that pt isn't acting like her usual self. Pt states that she has felt confused for "a little bit." Daughter reports that last night pt didn't want to eat. Her last medication dosage was at 7 PM last night. This morning, pt was lethargic and had soiled herself. Daughter isn't sure if patient had fever this morning but reports it was 101 in the ED. Pt has history of seizures. Last week pt has neurology appointment and is scheduled for EEG this Friday, 03/04/2015 ( 2 days from now). Daughter reports that pt gets like this post seizures and with UTIs. Pt was recently put on Dilantin.    Past Medical History  Diagnosis Date  . Dementia   . Bipolar 1 disorder   . Hyperlipemia   . Hypertension    History reviewed. No pertinent past surgical history. No family history on file. History  Substance Use Topics  . Smoking status: Never Smoker   . Smokeless tobacco: Not on file  . Alcohol Use: No   OB History    No data available     Review of Systems  Unable to perform ROS: Mental status change      Allergies  Review of patient's allergies indicates no known allergies.  Home Medications   Prior to Admission medications   Medication Sig Start Date End Date Taking? Authorizing  Provider  acetaminophen (TYLENOL) 500 MG tablet Take 500 mg by mouth 3 (three) times daily as needed. For pain/fever    Yes Historical Provider, MD  amLODipine (NORVASC) 5 MG tablet Take 5 mg by mouth daily.     Yes Historical Provider, MD  fluticasone (CUTIVATE) 0.05 % cream Apply 1 application topically 2 (two) times daily. To affected areas of face and ears   Yes Historical Provider, MD  gabapentin (NEURONTIN) 300 MG capsule Take 300 mg by mouth 2 (two) times daily.   Yes Historical Provider, MD  galantamine (RAZADYNE) 4 MG tablet Take 4 mg by mouth 2 (two) times daily.   Yes Historical Provider, MD  mirtazapine (REMERON) 15 MG tablet Take 15 mg by mouth at bedtime.   Yes Historical Provider, MD  multivitamin-iron-minerals-folic acid (CENTRUM) chewable tablet Chew 1 tablet by mouth daily.   Yes Historical Provider, MD  phenytoin (DILANTIN) 125 MG/5ML suspension Take 300 mg by mouth at bedtime.   Yes Historical Provider, MD  risperiDONE (RISPERDAL) 0.25 MG tablet Take 0.25 mg by mouth daily. Anxiety or agitation   Yes Historical Provider, MD  sertraline (ZOLOFT) 50 MG tablet Take 50 mg by mouth daily.   Yes Historical Provider, MD  simvastatin (ZOCOR) 20 MG tablet Take 20 mg by mouth at bedtime.     Yes Historical Provider, MD  solifenacin (VESICARE) 5 MG tablet Take 5 mg by mouth at bedtime.   Yes Historical Provider, MD  traZODone (DESYREL) 50 MG tablet Take 50 mg by mouth at bedtime. Takes  in the mornings and at noon.  Takes 100 mg at bedtime.   Yes Historical Provider, MD  Cephalexin 500 MG tablet Take 1 tablet (500 mg total) by mouth 2 (two) times daily. Patient not taking: Reported on 03/02/2015 01/09/15   Layla Maw Ward, DO  feeding supplement (ENSURE CLINICAL STRENGTH) LIQD Take 237 mLs by mouth 3 (three) times daily between meals. Patient not taking: Reported on 03/02/2015 05/04/11   Christiane Ha, MD  levETIRAcetam (KEPPRA) 250 MG tablet Take 1 tablet (250 mg total) by mouth 2  (two) times daily. Patient not taking: Reported on 03/02/2015 01/09/15   Layla Maw Ward, DO   Triage Vitals: BP 117/74 mmHg  Pulse 101  Temp(Src) 98.6 F (37 C) (Rectal)  SpO2 94%   Physical Exam  Constitutional: She is oriented to person, place, and time. She appears well-developed and well-nourished. No distress.  Confused.   HENT:  Head: Normocephalic and atraumatic.  Eyes: Conjunctivae and EOM are normal.  Neck: Neck supple. No tracheal deviation present.  Cardiovascular: Normal rate.   Pulmonary/Chest: Effort normal. No respiratory distress.  Musculoskeletal: Normal range of motion.  Mild bilateral lower extremity edema.   Neurological: She is alert and oriented to person, place, and time.  Skin: Skin is warm and dry.  Psychiatric: She has a normal mood and affect. Her behavior is normal.  Nursing note and vitals reviewed.   ED Course  Procedures (including critical care time)  DIAGNOSTIC STUDIES: Oxygen Saturation is 94% on RA, adequate by my interpretation.    COORDINATION OF CARE: 11:24 AM-Discussed treatment plan which includes UA, CBC, CMP with pt at bedside and pt agreed to plan.   Labs Review Labs Reviewed  COMPREHENSIVE METABOLIC PANEL - Abnormal; Notable for the following:    Glucose, Bld 138 (*)    GFR calc non Af Amer 57 (*)    All other components within normal limits  URINALYSIS, ROUTINE W REFLEX MICROSCOPIC - Abnormal; Notable for the following:    APPearance HAZY (*)    Specific Gravity, Urine >1.030 (*)    Hgb urine dipstick SMALL (*)    Protein, ur 30 (*)    Nitrite POSITIVE (*)    Leukocytes, UA SMALL (*)    All other components within normal limits  URINE MICROSCOPIC-ADD ON - Abnormal; Notable for the following:    Bacteria, UA MANY (*)    All other components within normal limits  URINE CULTURE  MRSA PCR SCREENING  CBC WITH DIFFERENTIAL/PLATELET  PHENYTOIN LEVEL, FREE AND TOTAL    Imaging Review Dg Chest Portable 1 View  03/02/2015    CLINICAL DATA:  Altered mental status  EXAM: PORTABLE CHEST - 1 VIEW  COMPARISON:  01/09/2015  FINDINGS: Cardiac shadow is stable. The lungs are poorly inflated although no focal infiltrate or sizable effusion is seen. Tortuosity of the thoracic aorta is noted. Degenerative changes of thoracic spine are noted.  IMPRESSION: No active disease.   Electronically Signed   By: Alcide Clever M.D.   On: 03/02/2015 12:36     EKG Interpretation   Date/Time:  Wednesday Mar 02 2015 10:50:54 EDT Ventricular Rate:  101 PR Interval:  165 QRS Duration: 75 QT Interval:  377 QTC Calculation: 489 R Axis:   -26 Text Interpretation:  Sinus tachycardia Inferior  infarct, old Consider  anterior infarct Confirmed by Rubin PayorPICKERING  MD, Billiejo Sorto [phone]([id number] on 03/02/2015  11:10:19 AM      MDM   Final diagnoses:  Acute cystitis without hematuria  Dementia, without behavioral disturbance   Patient with altered mental status. Found to have UTI. Will admit to internal medicine.  I personally performed the services described in this documentation, which was scribed in my presence. The recorded information has been reviewed and is accurate.       Benjiman CoreNathan Linkon Siverson, MD 03/02/15 [id number]

## 2015-03-02 NOTE — H&P (Signed)
[facility] History and Physical  Michelle Escobar Luera WUJ:[id number] DOB: 1926-11-03 DOA: 03/02/2015  Referring physician: ER, Dr. Rubin PayorPickering PCP: Default, Provider, MD   Chief Complaint: Altered mental status  HPI: Michelle Escobar Pharr is a 81 y.o. female  This is an 81 year old lady who has Alzheimer's dementia and by the account of the daughter was at the bedside, this is mild-to-moderate in nature. She now presents because she was found relatively unresponsive this morning and apparently there was a fever associated with this. The patient herself cannot give me any clear history due to her dementia. When she was seen in the emergency room, she was afebrile. There were no other symptoms such as vomiting, abdominal pain, dyspnea or chest pain. She is now being admitted for further management.   Review of Systems:  Apart from symptoms above, all systems negative.   Past Medical History  Diagnosis Date  . Dementia   . Bipolar 1 disorder   . Hyperlipemia   . Hypertension    History reviewed. No pertinent past surgical history. Social History:  reports that she has never smoked. She does not have any smokeless tobacco history on file. She reports that she does not drink alcohol or use illicit drugs.  No Known Allergies   Family history: This could not be obtained from the patient. The daughter was not available for this part of the conversation.  Prior to Admission medications   Medication Sig Start Date End Date Taking? Authorizing Provider  acetaminophen (TYLENOL) 500 MG tablet Take 500 mg by mouth 3 (three) times daily as needed. For pain/fever    Yes Historical Provider, MD  amLODipine (NORVASC) 5 MG tablet Take 5 mg by mouth daily.     Yes Historical Provider, MD  fluticasone (CUTIVATE) 0.05 % cream Apply 1 application topically 2 (two) times daily. To affected areas of face and ears   Yes Historical Provider, MD  gabapentin (NEURONTIN) 300 MG capsule Take 300 mg by mouth 2 (two) times  daily.   Yes Historical Provider, MD  galantamine (RAZADYNE) 4 MG tablet Take 4 mg by mouth 2 (two) times daily.   Yes Historical Provider, MD  mirtazapine (REMERON) 15 MG tablet Take 15 mg by mouth at bedtime.   Yes Historical Provider, MD  multivitamin-iron-minerals-folic acid (CENTRUM) chewable tablet Chew 1 tablet by mouth daily.   Yes Historical Provider, MD  phenytoin (DILANTIN) 125 MG/5ML suspension Take 300 mg by mouth at bedtime.   Yes Historical Provider, MD  risperiDONE (RISPERDAL) 0.25 MG tablet Take 0.25 mg by mouth daily. Anxiety or agitation   Yes Historical Provider, MD  sertraline (ZOLOFT) 50 MG tablet Take 50 mg by mouth daily.   Yes Historical Provider, MD  simvastatin (ZOCOR) 20 MG tablet Take 20 mg by mouth at bedtime.     Yes Historical Provider, MD  solifenacin (VESICARE) 5 MG tablet Take 5 mg by mouth at bedtime.   Yes Historical Provider, MD  traZODone (DESYREL) 50 MG tablet Take 50 mg by mouth at bedtime. Takes 25mg  in the mornings and at noon.  Takes 100 mg at bedtime.   Yes Historical Provider, MD  Cephalexin 500 MG tablet Take 1 tablet (500 mg total) by mouth 2 (two) times daily. Patient not taking: Reported on 03/02/2015 01/09/15   Layla MawKristen N Ward, DO  feeding supplement (ENSURE CLINICAL STRENGTH) LIQD Take 237 mLs by mouth 3 (three) times daily between meals. Patient not taking: Reported on 03/02/2015 05/04/11   Christiane Haorinna L Sullivan, MD  levETIRAcetam (  KEPPRA) 250 MG tablet Take 1 tablet (250 mg total) by mouth 2 (two) times daily. Patient not taking: Reported on 03/02/2015 01/09/15   Layla MawKristen N Ward, DO   Physical Exam: Filed Vitals:   03/02/15 1200 03/02/15 1230 03/02/15 1300 03/02/15 1343  BP: 119/70 120/81 122/70 136/70  Pulse: 92 88 90 80  Temp:    98.2 F (36.8 C)  TempSrc:    Axillary  Resp: 13 15 15    Height:    4\' 9"  (1.448 m)  Weight:    55 kg (121 lb 4.1 oz)  SpO2: 96% 96% 95% 97%    Wt Readings from Last 3 Encounters:  03/02/15 55 kg (121 lb 4.1 oz)    01/09/15 58.968 kg (130 lb)  11/30/14 58.968 kg (130 lb)    General:  Appears calm and comfortable. Feels warm. Does not look clinically septic or toxic. Eyes: PERRL, normal lids, irises & conjunctiva ENT: grossly normal hearing, lips & tongue Neck: no LAD, masses or thyromegaly Cardiovascular: RRR, no m/r/g. No LE edema. Telemetry: SR, no arrhythmias  Respiratory: CTA bilaterally, no w/r/r. Normal respiratory effort. Abdomen: soft, ntnd Skin: no rash or induration seen on limited exam Musculoskeletal: grossly normal tone BUE/BLE Psychiatric: Not examined. Neurologic: grossly non-focal.          Labs on Admission:  Basic Metabolic Panel:  Recent Labs Lab 03/02/15 1120  NA 145  K 3.9  CL 107  CO2 31  GLUCOSE 138*  BUN 15  CREATININE 0.89  CALCIUM 9.6   Liver Function Tests:  Recent Labs Lab 03/02/15 1120  AST 27  ALT 17  ALKPHOS 90  BILITOT 0.5  PROT 7.0  ALBUMIN 3.5   No results for input(s): LIPASE, AMYLASE in the last 168 hours. No results for input(s): AMMONIA in the last 168 hours. CBC:  Recent Labs Lab 03/02/15 1120  WBC 7.5  NEUTROABS 4.7  HGB 12.1  HCT 36.2  MCV 85.2  PLT 162   Cardiac Enzymes: No results for input(s): CKTOTAL, CKMB, CKMBINDEX, TROPONINI in the last 168 hours.  BNP (last 3 results)  Recent Labs  01/09/15 1418  BNP 25.0    ProBNP (last 3 results) No results for input(s): PROBNP in the last 8760 hours.  CBG: No results for input(s): GLUCAP in the last 168 hours.  Radiological Exams on Admission: Dg Chest Portable 1 View  03/02/2015   CLINICAL DATA:  Altered mental status  EXAM: PORTABLE CHEST - 1 VIEW  COMPARISON:  01/09/2015  FINDINGS: Cardiac shadow is stable. The lungs are poorly inflated although no focal infiltrate or sizable effusion is seen. Tortuosity of the thoracic aorta is noted. Degenerative changes of thoracic spine are noted.  IMPRESSION: No active disease.   Electronically Signed   By: Alcide CleverMark  Lukens  M.D.   On: 03/02/2015 12:36      Assessment/Plan   1. Altered mental status. This is likely due to the UTI. 2. UTI. This will be treated with intravenous Rocephin. She will be given intravenous fluids also. 3. Hypertension. Stable. Continue with home medications. 4. Alzheimer's dementia. Stable.  Further recommendations will depend on patient's hospital progress.   Code Status: DO NOT RESUSCITATE. This was confirmed with the daughter.  DVT Prophylaxis: Heparin.  Family Communication: Discussed the plan with the daughter.   Disposition Plan: Back to the assisted living facility when medically stable.  Time spent: 60 minutes.  Wilson SingerGOSRANI,NIMISH C [facility] Pager [phone].

## 2015-03-03 DIAGNOSIS — N3000 Acute cystitis without hematuria: Secondary | ICD-10-CM | POA: Diagnosis not present

## 2015-03-03 DIAGNOSIS — G9341 Metabolic encephalopathy: Secondary | ICD-10-CM

## 2015-03-03 DIAGNOSIS — G40909 Epilepsy, unspecified, not intractable, without status epilepticus: Secondary | ICD-10-CM

## 2015-03-03 DIAGNOSIS — E785 Hyperlipidemia, unspecified: Secondary | ICD-10-CM | POA: Diagnosis present

## 2015-03-03 DIAGNOSIS — R197 Diarrhea, unspecified: Secondary | ICD-10-CM | POA: Diagnosis present

## 2015-03-03 DIAGNOSIS — F0390 Unspecified dementia without behavioral disturbance: Secondary | ICD-10-CM

## 2015-03-03 DIAGNOSIS — N390 Urinary tract infection, site not specified: Secondary | ICD-10-CM

## 2015-03-03 LAB — COMPREHENSIVE METABOLIC PANEL
ALT: 18 U/L (ref 14–54)
AST: 31 U/L (ref 15–41)
Albumin: 3.2 g/dL — ABNORMAL LOW (ref 3.5–5.0)
Alkaline Phosphatase: 80 U/L (ref 38–126)
Anion gap: 6 (ref 5–15)
BILIRUBIN TOTAL: 0.4 mg/dL (ref 0.3–1.2)
BUN: 10 mg/dL (ref 6–20)
CO2: 28 mmol/L (ref 22–32)
CREATININE: 0.59 mg/dL (ref 0.44–1.00)
Calcium: 8.9 mg/dL (ref 8.9–10.3)
Chloride: 107 mmol/L (ref 101–111)
GFR calc Af Amer: >60 mL/min (ref >60)
GFR calc non Af Amer: >60 mL/min (ref >60)
Glucose, Bld: 113 mg/dL — ABNORMAL HIGH (ref 65–99)
Potassium: 3.5 mmol/L (ref 3.5–5.1)
Sodium: 141 mmol/L (ref 135–145)
TOTAL PROTEIN: 6.4 g/dL — AB (ref 6.5–8.1)

## 2015-03-03 LAB — PHENYTOIN LEVEL, FREE AND TOTAL
Phenytoin, Free: 0.6 ug/mL — ABNORMAL LOW (ref 1.0–2.0)
Phenytoin, Total: 7.9 ug/mL — ABNORMAL LOW (ref 10.0–20.0)

## 2015-03-03 LAB — CBC
HCT: 34.3 % — ABNORMAL LOW (ref 36.0–46.0)
Hemoglobin: 11.6 g/dL — ABNORMAL LOW (ref 12.0–15.0)
MCH: 28.6 pg (ref 26.0–34.0)
MCHC: 33.8 g/dL (ref 30.0–36.0)
MCV: 84.5 fL (ref 78.0–100.0)
PLATELETS: 160 10*3/uL (ref 150–400)
RBC: 4.06 MIL/uL (ref 3.87–5.11)
RDW: 13.6 % (ref 11.5–15.5)
WBC: 6.3 10*3/uL (ref 4.0–10.5)

## 2015-03-03 LAB — CLOSTRIDIUM DIFFICILE BY PCR: Toxigenic C. Difficile by PCR: NEGATIVE

## 2015-03-03 LAB — MRSA PCR SCREENING: MRSA by PCR: NEGATIVE

## 2015-03-03 MED ORDER — ALPRAZOLAM 0.25 MG PO TABS
0.2500 mg | ORAL_TABLET | Freq: Two times a day (BID) | ORAL | Status: DC | PRN
  Administered 2015-03-03: 0.25 mg via ORAL
  Filled 2015-03-03: qty 1

## 2015-03-03 NOTE — Progress Notes (Signed)
[facility] PROGRESS NOTE  Michelle Escobar ZOX:[id number] DOB: February 01, 1927 DOA: 03/02/2015 PCP: Default, Provider, MD  Assessment/Plan: 1. Escherichia coli urinary tract infection. Continue on Rocephin and follow-up culture sensitivities. 2. Metabolic encephalopathy superimposed on dementia. Appears to be improving, although not quite returned to baseline. Continue current treatments and observe. Daughter reports the patient normally ambulates and is mostly independent. We'll try and ambulate patient tomorrow. 3. Diarrhea. Check stool for C. difficile. 4. Essential hypertension. Continue outpatient regimen. Stable. 5. Seizure disorder. Continue Dilantin. 6. Hyperlipidemia. Continue statin  Code Status: DNR Family Communication: discussed with daughter at the bedside Disposition Plan: discharge back to facility when stable   Consultants:    Procedures:    Antibiotics:  Rocephin 5/18>>  HPI/Subjective: Patient is confused, but daughter reports that she is closer to her baseline. Her daughter feels that her mental status is significantly improved since admission. Her daughter does report that the patient has been having increasing frequency of loose bowel movements, having 3 today thus far.  Objective: Filed Vitals:   03/03/15 1335  BP: 141/67  Pulse: 82  Temp: 98 F (36.7 C)  Resp: 18    Intake/Output Summary (Last 24 hours) at 03/03/15 1817 Last data filed at 03/03/15 1337  Gross per 24 hour  Intake   1393 ml  Output      0 ml  Net   1393 ml   Filed Weights   03/02/15 1343  Weight: 55 kg (121 lb 4.1 oz)    Exam:   General:  NAD, confused  Cardiovascular: s1, s2 rrr  Respiratory: cta b  Abdomen: soft, nt, nd, bs+  Musculoskeletal: no edema b/l   Data Reviewed: Basic Metabolic Panel:  Recent Labs Lab 03/02/15 1120 03/03/15 0556  NA 145 141  K 3.9 3.5  CL 107 107  CO2 31 28  GLUCOSE 138* 113*  BUN 15 10  CREATININE 0.89 0.59  CALCIUM 9.6  8.9   Liver Function Tests:  Recent Labs Lab 03/02/15 1120 03/03/15 0556  AST 27 31  ALT 17 18  ALKPHOS 90 80  BILITOT 0.5 0.4  PROT 7.0 6.4*  ALBUMIN 3.5 3.2*   No results for input(s): LIPASE, AMYLASE in the last 168 hours. No results for input(s): AMMONIA in the last 168 hours. CBC:  Recent Labs Lab 03/02/15 1120 03/03/15 0556  WBC 7.5 6.3  NEUTROABS 4.7  --   HGB 12.1 11.6*  HCT 36.2 34.3*  MCV 85.2 84.5  PLT 162 160   Cardiac Enzymes: No results for input(s): CKTOTAL, CKMB, CKMBINDEX, TROPONINI in the last 168 hours. BNP (last 3 results)  Recent Labs  01/09/15 1418  BNP 25.0    ProBNP (last 3 results) No results for input(s): PROBNP in the last 8760 hours.  CBG: No results for input(s): GLUCAP in the last 168 hours.  Recent Results (from the past 240 hour(s))  Urine culture     Status: None (Preliminary result)   Collection Time: 03/02/15 12:00 PM  Result Value Ref Range Status   Specimen Description URINE, CATHETERIZED  Final   Special Requests NONE  Final   Colony Count   Final    >=100,000 COLONIES/ML Performed at [facility]    Culture   Final    ESCHERICHIA COLI Performed at [facility]    Report Status PENDING  Incomplete     Studies: Dg Chest Portable 1 View  03/02/2015   CLINICAL DATA:  Altered mental status  EXAM: PORTABLE CHEST -  1 VIEW  COMPARISON:  01/09/2015  FINDINGS: Cardiac shadow is stable. The lungs are poorly inflated although no focal infiltrate or sizable effusion is seen. Tortuosity of the thoracic aorta is noted. Degenerative changes of thoracic spine are noted.  IMPRESSION: No active disease.   Electronically Signed   By: Alcide CleverMark  Lukens M.D.   On: 03/02/2015 12:36    Scheduled Meds: . amLODipine  5 mg Oral Daily  . cefTRIAXone (ROCEPHIN)  IV  1 g Intravenous Q24H  . darifenacin  7.5 mg Oral Daily  . fluticasone  1 application Topical BID  . gabapentin  300 mg Oral BID  . galantamine  4 mg Oral BID  WC  . heparin  5,000 Units Subcutaneous 3 times per day  . mirtazapine  15 mg Oral QHS  . multivitamin with minerals  1 tablet Oral Daily  . phenytoin  300 mg Oral QHS  . risperiDONE  0.25 mg Oral Daily  . sertraline  50 mg Oral Daily  . simvastatin  20 mg Oral QHS  . traZODone  50 mg Oral QHS   Continuous Infusions: . sodium chloride 75 mL/hr at 03/03/15 [id number]    Active Problems:   UTI (urinary tract infection)   Dementia   HTN (hypertension), benign   UTI (lower urinary tract infection)   Metabolic encephalopathy   Diarrhea   Seizure disorder   Hyperlipidemia    Time spent: 35mins    Michelle Escobar  [facility] Pager [phone]. If 7PM-7AM, please contact night-coverage at [email or web address], password [email or web address] 03/03/2015, 6:17 PM  LOS: 1 day

## 2015-03-03 NOTE — Care Management Note (Signed)
Case Management Note  Patient Details  Name: Paul HalfRasheeda Mennella MRN: [id number] Date of Birth: [birth year]  Subjective/Objective:                  [facility] Pt will return to facility at discharge.  Action/Plan: Anticipate discharge back to facility within 24 hours. CSW is aware and will arrange discharge to facility.  Expected Discharge Date:                  Expected Discharge Plan:  Assisted Living / Rest Home  In-House Referral:  Clinical Social Work  Discharge planning Services  CM Consult  Post Acute Care Choice:  NA Choice offered to:  NA  DME Arranged:    DME Agency:     HH Arranged:    HH Agency:     Status of Service:  Completed, signed off  Medicare Important Message Given:    Date Medicare IM Given:    Medicare IM give by:    Date Additional Medicare IM Given:    Additional Medicare Important Message give by:     If discussed at Long Length of Stay Meetings, dates discussed:    Additional Comments:  Cheryl FlashBlackwell, Tylor Gambrill Crowder, RN 03/03/2015, 11:51 AM

## 2015-03-03 NOTE — Clinical Social Work Note (Signed)
Clinical Social Work Assessment  Patient Details  Name: Michelle Escobar MRN: [id number] Date of Birth: May 31, 1927  Date of referral:  03/03/15               Reason for consult:  Facility Placement                Permission sought to share information with:    Permission granted to share information::     Name::        Agency::     Relationship::     Contact Information:     Housing/Transportation Living arrangements for the past 2 months:   ([place]) Source of Information:  Adult Children Patient Interpreter Needed:  None Criminal Activity/Legal Involvement Pertinent to Current Situation/Hospitalization:  No - Comment as needed Significant Relationships:  Adult Children Lives with:  Facility Resident Do you feel safe going back to the place where you live?  Yes Need for family participation in patient care:   (daughter is legal guardian)  Care giving concerns:  Pt is long term resident at [facility]    Social Worker assessment / plan:  CSW met with pt's daughter/legal guardian, Michelle Escobar at bedside. Pt alert, but oriented to self only due to dementia. Michelle Escobar reports pt has been a resident at [facility] for about 4 years. She was caring for her at home, but this became more than they could manage. They found [facility] and Michelle Escobar has been very pleased with facility. She states that she lives about 5 minutes away and is able to visit pt regularly. Michelle Escobar shared that pt adopted her and she promised to care for pt. Pt retired from [employer] after 30 years. Michelle Escobar likes the fact that [facility] is family run and said it is easier for her to have pt at a facility that she feels so comfortable with. Michelle Escobar is hopeful to take pt home again at some point, but for now requests pt to return to [place] at d/c. Per Crystal at facility, pt requires limited assist with ADLs. She ambulates with a walker. Okay to return.   Employment status:  Retired Forensic scientist:  Managed  Care PT Recommendations:  Not assessed at this time Information / Referral to community resources:   (Return to [employer])  Patient/Family's Response to care:  Pt's daughter requests that pt return to [facility] at d/c.   Patient/Family's Understanding of and Emotional Response to Diagnosis, Current Treatment, and Prognosis:  Pt's daughter appears to have good understanding of pt's medical history. She reports many people in pt's family have had dementia and she has been prepared for this for awhile now. Pt was diagnosed in 2010.   Emotional Assessment Appearance:  Appears stated age Attitude/Demeanor/Rapport:  Unable to Assess Affect (typically observed):  Unable to Assess Orientation:  Oriented to Self Alcohol / Substance use:  Not Applicable Psych involvement (Current and /or in the community):  No (Comment)  Discharge Needs  Concerns to be addressed:  Discharge Planning Concerns Readmission within the last 30 days:  No Current discharge risk:  None Barriers to Discharge:  Continued Medical Work up   [employer], [employer] 03/03/2015, 10:42 AM [phone]

## 2015-03-03 NOTE — Progress Notes (Signed)
PT Cancellation Note  Patient Details Name: Michelle HalfRasheeda Pallo MRN: [id number] DOB: 01-Nov-1926   Cancelled Treatment:    Reason Eval/Treat Not Completed: PT screened, no needs identified, will sign off.   Pt has advanced dementia and became agitated with my presence.  Her daughter was able to get her up and she walked in the room about 6' and then refused to move from this position.  I had to leave the room to allow her daughter to try to redirect her but this has not worked.  RN eventually came in to the room to assist the daughter.  Pt should be able to transition back to [facility].  She is not appropriate for any PT.   Myrlene BrokerBrown, Sherriann Szuch L 03/03/2015, 12:27 PM

## 2015-03-04 DIAGNOSIS — N3000 Acute cystitis without hematuria: Principal | ICD-10-CM

## 2015-03-04 DIAGNOSIS — I10 Essential (primary) hypertension: Secondary | ICD-10-CM

## 2015-03-04 LAB — CBC
HCT: 32.4 % — ABNORMAL LOW (ref 36.0–46.0)
HEMOGLOBIN: 10.8 g/dL — AB (ref 12.0–15.0)
MCH: 28.2 pg (ref 26.0–34.0)
MCHC: 33.3 g/dL (ref 30.0–36.0)
MCV: 84.6 fL (ref 78.0–100.0)
Platelets: 164 10*3/uL (ref 150–400)
RBC: 3.83 MIL/uL — ABNORMAL LOW (ref 3.87–5.11)
RDW: 13.5 % (ref 11.5–15.5)
WBC: 5.3 10*3/uL (ref 4.0–10.5)

## 2015-03-04 LAB — BASIC METABOLIC PANEL
ANION GAP: 5 (ref 5–15)
BUN: 6 mg/dL (ref 6–20)
CHLORIDE: 108 mmol/L (ref 101–111)
CO2: 30 mmol/L (ref 22–32)
CREATININE: 0.63 mg/dL (ref 0.44–1.00)
Calcium: 8.9 mg/dL (ref 8.9–10.3)
GFR calc non Af Amer: >60 mL/min (ref >60)
Glucose, Bld: 105 mg/dL — ABNORMAL HIGH (ref 65–99)
POTASSIUM: 3.6 mmol/L (ref 3.5–5.1)
SODIUM: 143 mmol/L (ref 135–145)

## 2015-03-04 LAB — URINE CULTURE: Colony Count: >=100000

## 2015-03-04 MED ORDER — GABAPENTIN 100 MG PO CAPS
200.0000 mg | ORAL_CAPSULE | Freq: Two times a day (BID) | ORAL | Status: DC
  Administered 2015-03-04 – 2015-03-05 (×2): 200 mg via ORAL
  Filled 2015-03-04 (×2): qty 2

## 2015-03-04 NOTE — Progress Notes (Signed)
[facility] PROGRESS NOTE  Michelle Escobar RUE:[id number] DOB: Mar 12, 1927 DOA: 03/02/2015 PCP: Default, Provider, MD  Assessment/Plan: 1. Escherichia coli urinary tract infection. Continue on Rocephin day #2. Culture +ecoli.  2. Metabolic encephalopathy superimposed on dementia. Somewhat lethargic this afternoon. May be related to meds per nursing staff as reportedly more alert this am. Continue current treatments and observe. baseline normally ambulates and is mostly independent.  3. Diarrhea. C. Difficile negative. No further stools. monitor 4. Essential hypertension. Somewhat labile this afternoon.  Continue outpatient regimen. monitor 5. Seizure disorder. No seizure activity. Continue Dilantin. 6. Hyperlipidemia. Continue statin   Code Status: DNR Family Communication: caregiver at bedside Disposition Plan: back to facility hopefully 24 hours   Consultants:  none  Procedures:  none  Antibiotics: Rocephin 03/03/15>> HPI/Subjective: Responds to vigourous sternal rub by swatting at hand.   Objective: Filed Vitals:   03/04/15 1410  BP: 99/50  Pulse: 80  Temp: 98.6 F (37 C)  Resp: 20    Intake/Output Summary (Last 24 hours) at 03/04/15 1418 Last data filed at 03/04/15 0800  Gross per 24 hour  Intake    120 ml  Output      0 ml  Net    120 ml   Filed Weights   03/02/15 1343  Weight: 55 kg (121 lb 4.1 oz)    Exam:   General:  Frail appearing, does no open eyes  Cardiovascular: RRR no mgr  Respiratory: normal effort BS distant   Abdomen: flat soft +BS non-tender  Musculoskeletal: no clubbing or cyanosis   Data Reviewed: Basic Metabolic Panel:  Recent Labs Lab 03/02/15 1120 03/03/15 0556 03/04/15 0622  NA 145 141 143  K 3.9 3.5 3.6  CL 107 107 108  CO2 31 28 30   GLUCOSE 138* 113* 105*  BUN 15 10 6   CREATININE 0.89 0.59 0.63  CALCIUM 9.6 8.9 8.9   Liver Function Tests:  Recent Labs Lab 03/02/15 1120 03/03/15 0556  AST 27 31  ALT  17 18  ALKPHOS 90 80  BILITOT 0.5 0.4  PROT 7.0 6.4*  ALBUMIN 3.5 3.2*   No results for input(s): LIPASE, AMYLASE in the last 168 hours. No results for input(s): AMMONIA in the last 168 hours. CBC:  Recent Labs Lab 03/02/15 1120 03/03/15 0556 03/04/15 0622  WBC 7.5 6.3 5.3  NEUTROABS 4.7  --   --   HGB 12.1 11.6* 10.8*  HCT 36.2 34.3* 32.4*  MCV 85.2 84.5 84.6  PLT 162 160 164   Cardiac Enzymes: No results for input(s): CKTOTAL, CKMB, CKMBINDEX, TROPONINI in the last 168 hours. BNP (last 3 results)  Recent Labs  01/09/15 1418  BNP 25.0    ProBNP (last 3 results) No results for input(s): PROBNP in the last 8760 hours.  CBG: No results for input(s): GLUCAP in the last 168 hours.  Recent Results (from the past 240 hour(s))  Urine culture     Status: None (Preliminary result)   Collection Time: 03/02/15 12:00 PM  Result Value Ref Range Status   Specimen Description URINE, CATHETERIZED  Final   Special Requests NONE  Final   Colony Count   Final    >=100,000 COLONIES/ML Performed at [facility]    Culture   Final    ESCHERICHIA COLI Performed at [facility]    Report Status PENDING  Incomplete  MRSA PCR Screening     Status: None   Collection Time: 03/03/15  3:20 PM  Result Value Ref Range  Status   MRSA by PCR NEGATIVE NEGATIVE Final    Comment:        The GeneXpert MRSA Assay (FDA approved for NASAL specimens only), is one component of a comprehensive MRSA colonization surveillance program. It is not intended to diagnose MRSA infection nor to guide or monitor treatment for MRSA infections.   Clostridium Difficile by PCR     Status: None   Collection Time: 03/03/15  3:20 PM  Result Value Ref Range Status   C difficile by pcr NEGATIVE NEGATIVE Final     Studies: No results found.  Scheduled Meds: . amLODipine  5 mg Oral Daily  . cefTRIAXone (ROCEPHIN)  IV  1 g Intravenous Q24H  . darifenacin  7.5 mg Oral Daily  . fluticasone   1 application Topical BID  . gabapentin  200 mg Oral BID  . galantamine  4 mg Oral BID WC  . heparin  5,000 Units Subcutaneous 3 times per day  . mirtazapine  15 mg Oral QHS  . multivitamin with minerals  1 tablet Oral Daily  . phenytoin  300 mg Oral QHS  . risperiDONE  0.25 mg Oral Daily  . sertraline  50 mg Oral Daily  . simvastatin  20 mg Oral QHS  . traZODone  50 mg Oral QHS   Continuous Infusions: . sodium chloride 75 mL/hr at 03/04/15 [id number]    Active Problems:   UTI (urinary tract infection)   Dementia   HTN (hypertension), benign   UTI (lower urinary tract infection)   Metabolic encephalopathy   Diarrhea   Seizure disorder   Hyperlipidemia    Time spent: 30 minutes    [facility] Behavioral HealthBLACK,Michelle Escobar  [facility] Pager [phone]. If 7PM-7AM, please contact night-coverage at [email or web address], password [email or web address] 03/04/2015, 2:18 PM  LOS: 2 days

## 2015-03-04 NOTE — Clinical Social Work Note (Signed)
CSW updated [facility] on pt. Facility agreeable to return if stable this weekend.   Michelle FennelKara Oneal Biglow, LCSW [phone]

## 2015-03-04 NOTE — Progress Notes (Addendum)
Patient combative, spitting medications out so unsure how much medication she actually ingested.

## 2015-03-05 MED ORDER — LOPERAMIDE HCL 2 MG PO TABS: 2.0000 mg | ORAL_TABLET | Freq: Four times a day (QID) | ORAL | Status: AC | PRN

## 2015-03-05 MED ORDER — AMOXICILLIN-POT CLAVULANATE 875-125 MG PO TABS: 1.0000 | ORAL_TABLET | Freq: Two times a day (BID) | ORAL | Status: DC

## 2015-03-05 NOTE — Discharge Summary (Signed)
Physician Discharge Summary  Michelle Escobar ZOX:[id number] DOB: 1927/01/18 DOA: 03/02/2015  PCP: Default, Provider, MD  Admit date: 03/02/2015 Discharge date: 03/05/2015  Time spent: 40 minutes  Recommendations for Outpatient Follow-up:  1. Discharge back to [facility] facility  Discharge Diagnoses:  Active Problems:   UTI (urinary tract infection)   Dementia   HTN (hypertension), benign   UTI (lower urinary tract infection)   Metabolic encephalopathy   Diarrhea   Seizure disorder   Hyperlipidemia   Acute cystitis without hematuria   Discharge Condition: improved  Diet recommendation: low salt  Filed Weights   03/02/15 1343  Weight: 55 kg (121 lb 4.1 oz)    History of present illness:  This patient was admitted to the hospital with altered mental status. She has known Alzheimer's dementia. She is found to be relatively unresponsive on the morning of admission and had associated fever. She was found to have a urinary tract infection admitted for further treatment.  Hospital Course:  Patient was started on Rocephin. Urine culture was positive for Escherichia coli. Patient became afebrile. Leukocytosis resolved. Patient's mental status has improved with IV antibiotics and IV fluids. She's been transitioned to oral antibiotics. Her mental status appears to be back to baseline. She has developed diarrhea and stool for C. difficile was found to be negative. This is likely related to her antibiotics. We'll provide Imodium as needed. Remainder of medical problems are stable. She'll be discharged back to her long-term care facility.  Procedures:    Consultations:    Discharge Exam: Filed Vitals:   03/04/15 2158  BP: 142/62  Pulse: 82  Temp: 98.5 F (36.9 C)  Resp: 20    General: NAD Cardiovascular: S1, S2 RRR Respiratory: CTA B  Discharge Instructions   Discharge Instructions    Call MD for:  temperature >100.4    Complete by:  As directed      Diet -  low sodium heart healthy    Complete by:  As directed      Increase activity slowly    Complete by:  As directed           Current Discharge Medication List    START taking these medications   Details  amoxicillin-clavulanate (AUGMENTIN) 875-125 MG per tablet Take 1 tablet by mouth 2 (two) times daily. Qty: 6 tablet, Refills: 0    loperamide (IMODIUM A-D) 2 MG tablet Take 1 tablet (2 mg total) by mouth 4 (four) times daily as needed for diarrhea or loose stools. Qty: 30 tablet, Refills: 0      CONTINUE these medications which have NOT CHANGED   Details  acetaminophen (TYLENOL) 500 MG tablet Take 500 mg by mouth 3 (three) times daily as needed. For pain/fever     amLODipine (NORVASC) 5 MG tablet Take 5 mg by mouth daily.      fluticasone (CUTIVATE) 0.05 % cream Apply 1 application topically 2 (two) times daily. To affected areas of face and ears    gabapentin (NEURONTIN) 300 MG capsule Take 300 mg by mouth 2 (two) times daily.    galantamine (RAZADYNE) 4 MG tablet Take 4 mg by mouth 2 (two) times daily.    mirtazapine (REMERON) 15 MG tablet Take 15 mg by mouth at bedtime.    multivitamin-iron-minerals-folic acid (CENTRUM) chewable tablet Chew 1 tablet by mouth daily.    phenytoin (DILANTIN) 125 MG/5ML suspension Take 300 mg by mouth at bedtime.    risperiDONE (RISPERDAL) 0.25 MG tablet Take 0.25 mg  by mouth daily. Anxiety or agitation    sertraline (ZOLOFT) 50 MG tablet Take 50 mg by mouth daily.    simvastatin (ZOCOR) 20 MG tablet Take 20 mg by mouth at bedtime.      solifenacin (VESICARE) 5 MG tablet Take 5 mg by mouth at bedtime.    traZODone (DESYREL) 50 MG tablet Take 50 mg by mouth at bedtime.     levETIRAcetam (KEPPRA) 250 MG tablet Take 1 tablet (250 mg total) by mouth 2 (two) times daily. Qty: 60 tablet, Refills: 1      STOP taking these medications     Cephalexin 500 MG tablet      feeding supplement (ENSURE CLINICAL STRENGTH) LIQD        No Known  Allergies    The results of significant diagnostics from this hospitalization (including imaging, microbiology, ancillary and laboratory) are listed below for reference.    Significant Diagnostic Studies: Dg Chest Portable 1 View  03/02/2015   CLINICAL DATA:  Altered mental status  EXAM: PORTABLE CHEST - 1 VIEW  COMPARISON:  01/09/2015  FINDINGS: Cardiac shadow is stable. The lungs are poorly inflated although no focal infiltrate or sizable effusion is seen. Tortuosity of the thoracic aorta is noted. Degenerative changes of thoracic spine are noted.  IMPRESSION: No active disease.   Electronically Signed   By: Alcide CleverMark  Lukens M.D.   On: 03/02/2015 12:36    Microbiology: Recent Results (from the past 240 hour(s))  Urine culture     Status: None   Collection Time: 03/02/15 12:00 PM  Result Value Ref Range Status   Specimen Description URINE, CATHETERIZED  Final   Special Requests NONE  Final   Colony Count   Final    >=100,000 COLONIES/ML Performed at [facility]    Culture   Final    ESCHERICHIA COLI Performed at [facility]    Report Status 03/04/2015 FINAL  Final   Organism ID, Bacteria ESCHERICHIA COLI  Final      Susceptibility   Escherichia coli - MIC*    AMPICILLIN 4 SENSITIVE Sensitive     CEFAZOLIN <=4 SENSITIVE Sensitive     CEFTRIAXONE <=1 SENSITIVE Sensitive     CIPROFLOXACIN >=4 RESISTANT Resistant     GENTAMICIN <=1 SENSITIVE Sensitive     LEVOFLOXACIN >=8 RESISTANT Resistant     NITROFURANTOIN <=16 SENSITIVE Sensitive     TOBRAMYCIN <=1 SENSITIVE Sensitive     TRIMETH/SULFA <=20 SENSITIVE Sensitive     PIP/TAZO <=4 SENSITIVE Sensitive     * ESCHERICHIA COLI  MRSA PCR Screening     Status: None   Collection Time: 03/03/15  3:20 PM  Result Value Ref Range Status   MRSA by PCR NEGATIVE NEGATIVE Final    Comment:        The GeneXpert MRSA Assay (FDA approved for NASAL specimens only), is one component of a comprehensive MRSA  colonization surveillance program. It is not intended to diagnose MRSA infection nor to guide or monitor treatment for MRSA infections.   Clostridium Difficile by PCR     Status: None   Collection Time: 03/03/15  3:20 PM  Result Value Ref Range Status   C difficile by pcr NEGATIVE NEGATIVE Final     Labs: Basic Metabolic Panel:  Recent Labs Lab 03/02/15 1120 03/03/15 0556 03/04/15 0622  NA 145 141 143  K 3.9 3.5 3.6  CL 107 107 108  CO2 31 28 30   GLUCOSE 138* 113* 105*  BUN  CREATININE 0.89 0.59 0.63  CALCIUM 9.6 8.9 8.9   Liver Function Tests:  Recent Labs Lab 03/02/15 1120 03/03/15 0556  AST 27 31  ALT 17 18  ALKPHOS 90 80  BILITOT 0.5 0.4  PROT 7.0 6.4*  ALBUMIN 3.5 3.2*   No results for input(s): LIPASE, AMYLASE in the last 168 hours. No results for input(s): AMMONIA in the last 168 hours. CBC:  Recent Labs Lab 03/02/15 1120 03/03/15 0556 03/04/15 0622  WBC 7.5 6.3 5.3  NEUTROABS 4.7  --   --   HGB 12.1 11.6* 10.8*  HCT 36.2 34.3* 32.4*  MCV 85.2 84.5 84.6  PLT 162 160 164   Cardiac Enzymes: No results for input(s): CKTOTAL, CKMB, CKMBINDEX, TROPONINI in the last 168 hours. BNP: BNP (last 3 results)  Recent Labs  01/09/15 1418  BNP 25.0    ProBNP (last 3 results) No results for input(s): PROBNP in the last 8760 hours.  CBG: No results for input(s): GLUCAP in the last 168 hours.     Signed:  Shaleen Talamantez  [facility] 03/05/2015, 1:16 PM

## 2015-04-22 ENCOUNTER — Encounter (HOSPITAL_COMMUNITY): Payer: Self-pay | Admitting: Emergency Medicine

## 2015-04-22 ENCOUNTER — Emergency Department (HOSPITAL_COMMUNITY): Payer: Medicare (Managed Care)

## 2015-04-22 ENCOUNTER — Emergency Department (HOSPITAL_COMMUNITY)
Admission: EM | Admit: 2015-04-22 | Discharge: 2015-04-22 | Disposition: A | Discharge status: Home / Self Care | Payer: Medicare (Managed Care) | Attending: Emergency Medicine | Admitting: Emergency Medicine

## 2015-04-22 DIAGNOSIS — F319 Bipolar disorder, unspecified: Secondary | ICD-10-CM | POA: Diagnosis not present

## 2015-04-22 DIAGNOSIS — Z79899 Other long term (current) drug therapy: Secondary | ICD-10-CM | POA: Diagnosis not present

## 2015-04-22 DIAGNOSIS — R410 Disorientation, unspecified: Secondary | ICD-10-CM

## 2015-04-22 DIAGNOSIS — Z7952 Long term (current) use of systemic steroids: Secondary | ICD-10-CM | POA: Insufficient documentation

## 2015-04-22 DIAGNOSIS — F0390 Unspecified dementia without behavioral disturbance: Secondary | ICD-10-CM | POA: Diagnosis not present

## 2015-04-22 DIAGNOSIS — I10 Essential (primary) hypertension: Secondary | ICD-10-CM | POA: Diagnosis not present

## 2015-04-22 DIAGNOSIS — E785 Hyperlipidemia, unspecified: Secondary | ICD-10-CM | POA: Diagnosis not present

## 2015-04-22 DIAGNOSIS — R4182 Altered mental status, unspecified: Secondary | ICD-10-CM | POA: Diagnosis present

## 2015-04-22 DIAGNOSIS — N390 Urinary tract infection, site not specified: Secondary | ICD-10-CM

## 2015-04-22 LAB — COMPREHENSIVE METABOLIC PANEL
ALK PHOS: 132 U/L — AB (ref 38–126)
ALT: 19 U/L (ref 14–54)
AST: 22 U/L (ref 15–41)
Albumin: 3.8 g/dL (ref 3.5–5.0)
Anion gap: 7 (ref 5–15)
BILIRUBIN TOTAL: 0.3 mg/dL (ref 0.3–1.2)
BUN: 11 mg/dL (ref 6–20)
CO2: 29 mmol/L (ref 22–32)
Calcium: 9.5 mg/dL (ref 8.9–10.3)
Chloride: 111 mmol/L (ref 101–111)
Creatinine, Ser: 0.67 mg/dL (ref 0.44–1.00)
GFR calc Af Amer: >60 mL/min (ref >60)
GFR calc non Af Amer: >60 mL/min (ref >60)
GLUCOSE: 137 mg/dL — AB (ref 65–99)
POTASSIUM: 4 mmol/L (ref 3.5–5.1)
Sodium: 147 mmol/L — ABNORMAL HIGH (ref 135–145)
Total Protein: 7.4 g/dL (ref 6.5–8.1)

## 2015-04-22 LAB — CBC WITH DIFFERENTIAL/PLATELET
BASOS PCT: 0 % (ref 0–1)
Basophils Absolute: 0 10*3/uL (ref 0.0–0.1)
Eosinophils Absolute: 0.1 10*3/uL (ref 0.0–0.7)
Eosinophils Relative: 1 % (ref 0–5)
HCT: 38.4 % (ref 36.0–46.0)
HEMOGLOBIN: 12.6 g/dL (ref 12.0–15.0)
LYMPHS ABS: 1.8 10*3/uL (ref 0.7–4.0)
Lymphocytes Relative: 35 % (ref 12–46)
MCH: 28.1 pg (ref 26.0–34.0)
MCHC: 32.8 g/dL (ref 30.0–36.0)
MCV: 85.7 fL (ref 78.0–100.0)
MONO ABS: 0.4 10*3/uL (ref 0.1–1.0)
MONOS PCT: 9 % (ref 3–12)
NEUTROS ABS: 2.8 10*3/uL (ref 1.7–7.7)
NEUTROS PCT: 55 % (ref 43–77)
Platelets: 161 10*3/uL (ref 150–400)
RBC: 4.48 MIL/uL (ref 3.87–5.11)
RDW: 14.5 % (ref 11.5–15.5)
WBC: 5.1 10*3/uL (ref 4.0–10.5)

## 2015-04-22 LAB — URINALYSIS, ROUTINE W REFLEX MICROSCOPIC
Bilirubin Urine: NEGATIVE
GLUCOSE, UA: NEGATIVE mg/dL
KETONES UR: NEGATIVE mg/dL
Nitrite: POSITIVE — AB
Specific Gravity, Urine: 1.02 (ref 1.005–1.030)
Urobilinogen, UA: 0.2 mg/dL (ref 0.0–1.0)
pH: 6.5 (ref 5.0–8.0)

## 2015-04-22 LAB — URINE MICROSCOPIC-ADD ON

## 2015-04-22 MED ORDER — CEPHALEXIN 250 MG PO CAPS: 250.0000 mg | ORAL_CAPSULE | Freq: Four times a day (QID) | ORAL | Status: DC

## 2015-04-22 MED ORDER — DEXTROSE 5 % IV SOLN: 1.0000 g | Freq: Once | INTRAVENOUS | Status: DC

## 2015-04-22 MED ORDER — CIPROFLOXACIN HCL 250 MG PO TABS: 250.0000 mg | ORAL_TABLET | Freq: Two times a day (BID) | ORAL | Status: DC

## 2015-04-22 MED ORDER — CIPROFLOXACIN HCL 250 MG PO TABS
250.0000 mg | ORAL_TABLET | Freq: Once | ORAL | Status: AC
  Administered 2015-04-22: 250 mg via ORAL
  Filled 2015-04-22: qty 1

## 2015-04-22 NOTE — Discharge Instructions (Signed)

## 2015-04-22 NOTE — ED Provider Notes (Signed)
CSN: [id number]     Arrival date & time 04/22/15  [id number] History   First MD Initiated Contact with Patient 04/22/15 1905     Chief Complaint  Patient presents with  . Altered Mental Status    5 caveat secondary to dementia  (Consider location/radiation/quality/duration/timing/severity/associated sxs/prior Treatment) HPI  History obtained from caregiver. 81 year old female history of bipolar disorder and dementia who lives in a home care situation. The care provider states that she has been talking differently today. Normally she is able to communicate today her speech has been rapid and somewhat garbled. She has not had any lateralized deficits. She has been doing her normal activities of daily living that include ambulating and eating without difficulty. No fever has been noted.  Past Medical History  Diagnosis Date  . Dementia   . Bipolar 1 disorder   . Hyperlipemia   . Hypertension    History reviewed. No pertinent past surgical history. History reviewed. No pertinent family history. History  Substance Use Topics  . Smoking status: Never Smoker   . Smokeless tobacco: Not on file  . Alcohol Use: No   OB History    No data available     Review of Systems  Unable to perform ROS     Allergies  Review of patient's allergies indicates no known allergies.  Home Medications   Prior to Admission medications   Medication Sig Start Date End Date Taking? Authorizing Provider  acetaminophen (TYLENOL) 500 MG tablet Take 500 mg by mouth 3 (three) times daily as needed. For pain/fever    Yes Historical Provider, MD  amLODipine (NORVASC) 5 MG tablet Take 5 mg by mouth daily.     Yes Historical Provider, MD  amoxicillin (AMOXIL) 250 MG capsule Take 250 mg by mouth 3 (three) times daily. Course began 04/15/15 and is due to be completed 04/22/15 with one dose remaining   Yes Historical Provider, MD  fluticasone (CUTIVATE) 0.05 % cream Apply 1 application topically 2 (two) times daily. To  affected areas of face and ears   Yes Historical Provider, MD  folic acid (FOLVITE) 1 MG tablet Take 1 mg by mouth daily.   Yes Historical Provider, MD  gabapentin (NEURONTIN) 300 MG capsule Take 300 mg by mouth 3 (three) times daily.    Yes Historical Provider, MD  loperamide (IMODIUM A-D) 2 MG tablet Take 1 tablet (2 mg total) by mouth 4 (four) times daily as needed for diarrhea or loose stools. 03/05/15  Yes Erick BlinksJehanzeb Memon, MD  magnesium hydroxide (MILK OF MAGNESIA) 400 MG/5ML suspension Take 30 mLs by mouth daily as needed for mild constipation.   Yes Historical Provider, MD  mirtazapine (REMERON) 15 MG tablet Take 15 mg by mouth at bedtime.   Yes Historical Provider, MD  multivitamin-iron-minerals-folic acid (CENTRUM) chewable tablet Chew 1 tablet by mouth daily.   Yes Historical Provider, MD  phenytoin (DILANTIN) 125 MG/5ML suspension Take 300 mg by mouth at bedtime.   Yes Historical Provider, MD  risperiDONE (RISPERDAL) 0.25 MG tablet Take 0.25 mg by mouth daily. Anxiety or agitation   Yes Historical Provider, MD  sertraline (ZOLOFT) 50 MG tablet Take 50 mg by mouth daily.   Yes Historical Provider, MD  simvastatin (ZOCOR) 20 MG tablet Take 20 mg by mouth at bedtime.     Yes Historical Provider, MD  solifenacin (VESICARE) 5 MG tablet Take 5 mg by mouth at bedtime.   Yes Historical Provider, MD  traZODone (DESYREL) 50 MG tablet Take 50 mg  by mouth at bedtime.    Yes Historical Provider, MD  amoxicillin-clavulanate (AUGMENTIN) 875-125 MG per tablet Take 1 tablet by mouth 2 (two) times daily. Patient not taking: Reported on 04/22/2015 03/05/15   Erick Blinks, MD   BP 147/78 mmHg  Pulse 74  Temp(Src) 98.7 F (37.1 C) (Rectal)  Resp 18  Ht  (1.575 m)  Wt 121 lb (54.885 kg)  BMI 22.13 kg/m2  SpO2 100% Physical Exam  Constitutional: She appears well-developed and well-nourished. No distress.  HENT:  Head: Normocephalic and atraumatic.  Right Ear: External ear normal.  Left Ear:  External ear normal.  Mouth/Throat: Oropharynx is clear and moist.  Eyes: Conjunctivae and EOM are normal. Pupils are equal, round, and reactive to light.  Neck: Normal range of motion. Neck supple.  Cardiovascular: Normal rate, regular rhythm and normal heart sounds.   Pulmonary/Chest: Effort normal.  Abdominal: Soft. Bowel sounds are normal.  Musculoskeletal: Normal range of motion.  Neurological: She is alert. She has normal strength and normal reflexes. No cranial nerve deficit.  Skin: Skin is warm and dry.  Psychiatric: Her mood appears anxious. Her speech is rapid and/or pressured. She is agitated.  Nursing note and vitals reviewed.   ED Course  Procedures (including critical care time) Labs Review Labs Reviewed  URINALYSIS, ROUTINE W REFLEX MICROSCOPIC (NOT AT [facility]) - Abnormal; Notable for the following:    APPearance HAZY (*)    Hgb urine dipstick SMALL (*)    Protein, ur TRACE (*)    Nitrite POSITIVE (*)    Leukocytes, UA LARGE (*)    All other components within normal limits  COMPREHENSIVE METABOLIC PANEL - Abnormal; Notable for the following:    Sodium 147 (*)    Glucose, Bld 137 (*)    Alkaline Phosphatase 132 (*)    All other components within normal limits  URINE MICROSCOPIC-ADD ON - Abnormal; Notable for the following:    Squamous Epithelial / LPF FEW (*)    Bacteria, UA MANY (*)    All other components within normal limits  CBC WITH DIFFERENTIAL/PLATELET    Imaging Review No results found.   EKG Interpretation None      MDM   Final diagnoses:  Confused  UTI (lower urinary tract infection)   This is an 81 year old female with dementia who presents with some more confusion than baseline today. Workup here is significant with urinary tract infection with too numerous to count white blood cells in urine which is nitrite positive. She otherwise appears hemodynamically stable here without fever or elevated white blood cell count.. Given failure of  outpatient treatment with amoxicillin will treat with Cipro I have discussed return precautions with patient's caregiver and she voices understanding.  Margarita Grizzle, MD 04/22/15 2233

## 2015-04-22 NOTE — ED Notes (Signed)
Call placed to daughter to update her on pt's condition and plan of action

## 2015-04-22 NOTE — ED Notes (Addendum)
Confused more than normal per nsg home.

## 2015-04-26 ENCOUNTER — Inpatient Hospital Stay (HOSPITAL_COMMUNITY)
Admission: EM | Admit: 2015-04-26 | Discharge: 2015-04-29 | DRG: 689 | Disposition: A | Discharge status: Skilled Nursing Facility | Payer: Medicare (Managed Care) | Attending: Internal Medicine | Admitting: Internal Medicine

## 2015-04-26 ENCOUNTER — Encounter (HOSPITAL_COMMUNITY): Payer: Self-pay | Admitting: Emergency Medicine

## 2015-04-26 DIAGNOSIS — F0390 Unspecified dementia without behavioral disturbance: Secondary | ICD-10-CM | POA: Diagnosis present

## 2015-04-26 DIAGNOSIS — G9340 Encephalopathy, unspecified: Secondary | ICD-10-CM | POA: Diagnosis present

## 2015-04-26 DIAGNOSIS — R5381 Other malaise: Secondary | ICD-10-CM

## 2015-04-26 DIAGNOSIS — F319 Bipolar disorder, unspecified: Secondary | ICD-10-CM | POA: Diagnosis present

## 2015-04-26 DIAGNOSIS — F0280 Dementia in other diseases classified elsewhere without behavioral disturbance: Secondary | ICD-10-CM | POA: Diagnosis present

## 2015-04-26 DIAGNOSIS — R4182 Altered mental status, unspecified: Secondary | ICD-10-CM | POA: Diagnosis present

## 2015-04-26 DIAGNOSIS — E785 Hyperlipidemia, unspecified: Secondary | ICD-10-CM | POA: Diagnosis present

## 2015-04-26 DIAGNOSIS — N3289 Other specified disorders of bladder: Secondary | ICD-10-CM | POA: Diagnosis present

## 2015-04-26 DIAGNOSIS — N390 Urinary tract infection, site not specified: Principal | ICD-10-CM | POA: Diagnosis present

## 2015-04-26 DIAGNOSIS — E43 Unspecified severe protein-calorie malnutrition: Secondary | ICD-10-CM | POA: Diagnosis present

## 2015-04-26 DIAGNOSIS — G629 Polyneuropathy, unspecified: Secondary | ICD-10-CM | POA: Diagnosis present

## 2015-04-26 DIAGNOSIS — B9620 Unspecified Escherichia coli [E. coli] as the cause of diseases classified elsewhere: Secondary | ICD-10-CM | POA: Diagnosis present

## 2015-04-26 DIAGNOSIS — I10 Essential (primary) hypertension: Secondary | ICD-10-CM | POA: Diagnosis present

## 2015-04-26 LAB — CBC WITH DIFFERENTIAL/PLATELET
Basophils Absolute: 0 10*3/uL (ref 0.0–0.1)
Basophils Relative: 0 % (ref 0–1)
Eosinophils Absolute: 0.1 10*3/uL (ref 0.0–0.7)
Eosinophils Relative: 1 % (ref 0–5)
HCT: 39.2 % (ref 36.0–46.0)
Hemoglobin: 13.1 g/dL (ref 12.0–15.0)
Lymphocytes Relative: 38 % (ref 12–46)
Lymphs Abs: 2.2 10*3/uL (ref 0.7–4.0)
MCH: 28.7 pg (ref 26.0–34.0)
MCHC: 33.4 g/dL (ref 30.0–36.0)
MCV: 86 fL (ref 78.0–100.0)
Monocytes Absolute: 0.5 10*3/uL (ref 0.1–1.0)
Monocytes Relative: 8 % (ref 3–12)
NEUTROS ABS: 3 10*3/uL (ref 1.7–7.7)
Neutrophils Relative %: 53 % (ref 43–77)
Platelets: 155 10*3/uL (ref 150–400)
RBC: 4.56 MIL/uL (ref 3.87–5.11)
RDW: 14.4 % (ref 11.5–15.5)
WBC: 5.7 10*3/uL (ref 4.0–10.5)

## 2015-04-26 LAB — COMPREHENSIVE METABOLIC PANEL
ALT: 18 U/L (ref 14–54)
ANION GAP: 6 (ref 5–15)
AST: 24 U/L (ref 15–41)
Albumin: 3.6 g/dL (ref 3.5–5.0)
Alkaline Phosphatase: 144 U/L — ABNORMAL HIGH (ref 38–126)
BILIRUBIN TOTAL: 0.4 mg/dL (ref 0.3–1.2)
BUN: 10 mg/dL (ref 6–20)
CO2: 32 mmol/L (ref 22–32)
Calcium: 9.3 mg/dL (ref 8.9–10.3)
Chloride: 105 mmol/L (ref 101–111)
Creatinine, Ser: 0.71 mg/dL (ref 0.44–1.00)
GFR calc Af Amer: >60 mL/min (ref >60)
Glucose, Bld: 99 mg/dL (ref 65–99)
Potassium: 4 mmol/L (ref 3.5–5.1)
Sodium: 143 mmol/L (ref 135–145)
TOTAL PROTEIN: 7.2 g/dL (ref 6.5–8.1)

## 2015-04-26 LAB — URINALYSIS, ROUTINE W REFLEX MICROSCOPIC
Bilirubin Urine: NEGATIVE
GLUCOSE, UA: NEGATIVE mg/dL
Ketones, ur: NEGATIVE mg/dL
LEUKOCYTES UA: NEGATIVE
NITRITE: POSITIVE — AB
Protein, ur: NEGATIVE mg/dL
Specific Gravity, Urine: >1.03 — ABNORMAL HIGH (ref 1.005–1.030)
UROBILINOGEN UA: 0.2 mg/dL (ref 0.0–1.0)
pH: 5.5 (ref 5.0–8.0)

## 2015-04-26 LAB — LACTIC ACID, PLASMA
LACTIC ACID, VENOUS: 1.1 mmol/L (ref 0.5–2.0)
Lactic Acid, Venous: 1.2 mmol/L (ref 0.5–2.0)

## 2015-04-26 LAB — URINE MICROSCOPIC-ADD ON

## 2015-04-26 LAB — TROPONIN I: Troponin I: <0.03 ng/mL (ref <0.031)

## 2015-04-26 LAB — LIPASE, BLOOD: LIPASE: 12 U/L — AB (ref 22–51)

## 2015-04-26 MED ORDER — HEPARIN SODIUM (PORCINE) 5000 UNIT/ML IJ SOLN
5000.0000 [IU] | Freq: Three times a day (TID) | INTRAMUSCULAR | Status: DC
  Administered 2015-04-27 – 2015-04-29 (×6): 5000 [IU] via SUBCUTANEOUS
  Filled 2015-04-26 (×7): qty 1

## 2015-04-26 MED ORDER — ONDANSETRON HCL 4 MG PO TABS: 4.0000 mg | ORAL_TABLET | Freq: Four times a day (QID) | ORAL | Status: DC | PRN

## 2015-04-26 MED ORDER — DEXTROSE 5 % IV SOLN
1.0000 g | INTRAVENOUS | Status: DC
  Administered 2015-04-27 – 2015-04-28 (×2): 1 g via INTRAVENOUS
  Filled 2015-04-26 (×5): qty 10

## 2015-04-26 MED ORDER — DARIFENACIN HYDROBROMIDE ER 7.5 MG PO TB24
7.5000 mg | ORAL_TABLET | Freq: Every day | ORAL | Status: DC
  Administered 2015-04-27 – 2015-04-29 (×3): 7.5 mg via ORAL
  Filled 2015-04-26 (×6): qty 1

## 2015-04-26 MED ORDER — SODIUM CHLORIDE 0.9 % IV SOLN
INTRAVENOUS | Status: DC
  Administered 2015-04-27: 75 mL/h via INTRAVENOUS
  Administered 2015-04-28 – 2015-04-29 (×3): via INTRAVENOUS

## 2015-04-26 MED ORDER — SODIUM CHLORIDE 0.9 % IV BOLUS (SEPSIS)
1000.0000 mL | Freq: Once | INTRAVENOUS | Status: AC
  Administered 2015-04-26: 1000 mL via INTRAVENOUS

## 2015-04-26 MED ORDER — PHENYTOIN 125 MG/5ML PO SUSP
300.0000 mg | Freq: Every day | ORAL | Status: DC
Start: 2015-04-26 — End: 2015-04-29
  Administered 2015-04-27 – 2015-04-28 (×3): 300 mg via ORAL
  Filled 2015-04-26 (×6): qty 12

## 2015-04-26 MED ORDER — GABAPENTIN 300 MG PO CAPS
300.0000 mg | ORAL_CAPSULE | Freq: Three times a day (TID) | ORAL | Status: DC
  Administered 2015-04-27 – 2015-04-29 (×8): 300 mg via ORAL
  Filled 2015-04-26 (×8): qty 1

## 2015-04-26 MED ORDER — DEXTROSE 5 % IV SOLN
1.0000 g | Freq: Once | INTRAVENOUS | Status: AC
  Administered 2015-04-26: 1 g via INTRAVENOUS
  Filled 2015-04-26: qty 10

## 2015-04-26 MED ORDER — AMLODIPINE BESYLATE 5 MG PO TABS
5.0000 mg | ORAL_TABLET | Freq: Every day | ORAL | Status: DC
  Administered 2015-04-27 – 2015-04-29 (×3): 5 mg via ORAL
  Filled 2015-04-26 (×3): qty 1

## 2015-04-26 MED ORDER — SIMVASTATIN 20 MG PO TABS
20.0000 mg | ORAL_TABLET | Freq: Every day | ORAL | Status: DC
  Administered 2015-04-27 – 2015-04-28 (×3): 20 mg via ORAL
  Filled 2015-04-26 (×3): qty 1

## 2015-04-26 MED ORDER — SERTRALINE HCL 50 MG PO TABS
50.0000 mg | ORAL_TABLET | Freq: Every day | ORAL | Status: DC
  Administered 2015-04-27 – 2015-04-29 (×3): 50 mg via ORAL
  Filled 2015-04-26 (×3): qty 1

## 2015-04-26 MED ORDER — MIRTAZAPINE 15 MG PO TABS
15.0000 mg | ORAL_TABLET | Freq: Every day | ORAL | Status: DC
  Administered 2015-04-27 – 2015-04-28 (×3): 15 mg via ORAL
  Filled 2015-04-26 (×5): qty 1

## 2015-04-26 MED ORDER — ENSURE ENLIVE PO LIQD
237.0000 mL | Freq: Three times a day (TID) | ORAL | Status: DC
  Administered 2015-04-27 – 2015-04-29 (×8): 237 mL via ORAL
  Filled 2015-04-26: qty 237

## 2015-04-26 MED ORDER — RISPERIDONE 0.5 MG PO TABS
0.2500 mg | ORAL_TABLET | Freq: Every day | ORAL | Status: DC
  Administered 2015-04-27 – 2015-04-29 (×3): 0.25 mg via ORAL
  Filled 2015-04-26 (×5): qty 1

## 2015-04-26 MED ORDER — TRAZODONE HCL 50 MG PO TABS
50.0000 mg | ORAL_TABLET | Freq: Every day | ORAL | Status: DC
  Administered 2015-04-27 – 2015-04-28 (×3): 50 mg via ORAL
  Filled 2015-04-26 (×3): qty 1

## 2015-04-26 MED ORDER — MAGNESIUM HYDROXIDE 400 MG/5ML PO SUSP: 30.0000 mL | Freq: Every day | ORAL | Status: DC | PRN

## 2015-04-26 MED ORDER — ONDANSETRON HCL 4 MG/2ML IJ SOLN: 4.0000 mg | Freq: Four times a day (QID) | INTRAMUSCULAR | Status: DC | PRN

## 2015-04-26 MED ORDER — ZINC OXIDE 20 % EX OINT
1.0000 "application " | TOPICAL_OINTMENT | Freq: Three times a day (TID) | CUTANEOUS | Status: DC | PRN
  Filled 2015-04-26: qty 114

## 2015-04-26 NOTE — ED Provider Notes (Signed)
CSN: [id number]     Arrival date & time 04/26/15  1428 History   First MD Initiated Contact with Patient 04/26/15 1516     Chief Complaint  Patient presents with  . Altered Mental Status  . Anorexia     (Consider location/radiation/quality/duration/timing/severity/associated sxs/prior Treatment) HPI.... Level V caveat for dementia. Patient lives in an assisted living facility. According to the caregiver, she was recently treated for urinary tract infection. She has been doing poorly lately. Decreased eating. Decreased oral intake. Decreased level of activity. Decreased generalized ADLs.  Past Medical History  Diagnosis Date  . Dementia   . Bipolar 1 disorder   . Hyperlipemia   . Hypertension    History reviewed. No pertinent past surgical history. History reviewed. No pertinent family history. History  Substance Use Topics  . Smoking status: Never Smoker   . Smokeless tobacco: Not on file  . Alcohol Use: No   OB History    No data available     Review of Systems  All other systems reviewed and are negative.     Allergies  Review of patient's allergies indicates no known allergies.  Home Medications   Prior to Admission medications   Medication Sig Start Date End Date Taking? Authorizing Provider  acetaminophen (TYLENOL) 500 MG tablet Take 500 mg by mouth 3 (three) times daily as needed. For pain/fever    Yes Historical Provider, MD  amLODipine (NORVASC) 5 MG tablet Take 5 mg by mouth daily.     Yes Historical Provider, MD  amoxicillin (AMOXIL) 250 MG capsule Take 250 mg by mouth 3 (three) times daily.   Yes Historical Provider, MD  ciprofloxacin (CIPRO) 250 MG tablet Take 1 tablet (250 mg total) by mouth 2 (two) times daily. 04/22/15  Yes Margarita Grizzle, MD  clotrimazole-betamethasone (LOTRISONE) cream Apply 1 application topically 2 (two) times daily as needed (dry skin).   Yes Historical Provider, MD  fluticasone (CUTIVATE) 0.05 % cream Apply 1 application topically 2  (two) times daily. To affected areas of face and ears   Yes Historical Provider, MD  folic acid (FOLVITE) 1 MG tablet Take 1 mg by mouth daily.   Yes Historical Provider, MD  gabapentin (NEURONTIN) 300 MG capsule Take 300 mg by mouth 3 (three) times daily.    Yes Historical Provider, MD  liver oil-zinc oxide (DESITIN) 40 % ointment Apply 1 application topically 3 (three) times daily as needed for irritation (irritation).   Yes Historical Provider, MD  loperamide (IMODIUM A-D) 2 MG tablet Take 1 tablet (2 mg total) by mouth 4 (four) times daily as needed for diarrhea or loose stools. 03/05/15  Yes Erick Blinks, MD  magnesium hydroxide (MILK OF MAGNESIA) 400 MG/5ML suspension Take 30 mLs by mouth daily as needed for mild constipation.   Yes Historical Provider, MD  mirtazapine (REMERON) 15 MG tablet Take 15 mg by mouth at bedtime.   Yes Historical Provider, MD  multivitamin-iron-minerals-folic acid (CENTRUM) chewable tablet Chew 1 tablet by mouth daily.   Yes Historical Provider, MD  nystatin cream (MYCOSTATIN) Apply 1 application topically as needed for dry skin (rash).   Yes Historical Provider, MD  phenytoin (DILANTIN) 125 MG/5ML suspension Take 300 mg by mouth at bedtime.   Yes Historical Provider, MD  risperiDONE (RISPERDAL) 0.25 MG tablet Take 0.25 mg by mouth daily. Anxiety or agitation   Yes Historical Provider, MD  sertraline (ZOLOFT) 50 MG tablet Take 50 mg by mouth daily.   Yes Historical Provider, MD  simvastatin (  ZOCOR) 20 MG tablet Take 20 mg by mouth at bedtime.     Yes Historical Provider, MD  solifenacin (VESICARE) 5 MG tablet Take 5 mg by mouth at bedtime.   Yes Historical Provider, MD  traZODone (DESYREL) 50 MG tablet Take 50 mg by mouth at bedtime.    Yes Historical Provider, MD  amoxicillin-clavulanate (AUGMENTIN) 875-125 MG per tablet Take 1 tablet by mouth 2 (two) times daily. Patient not taking: Reported on 04/22/2015 03/05/15   Erick BlinksJehanzeb Memon, MD   BP 125/69 mmHg  Pulse 70   Temp(Src) 98.6 F (37 C) (Oral)  Resp 12  Ht 4\' 11"  (1.499 m)  Wt 125 lb (56.7 kg)  BMI 25.23 kg/m2  SpO2 97% Physical Exam  Constitutional:  Pleasant but demented  HENT:  Head: Normocephalic and atraumatic.  Eyes: Conjunctivae and EOM are normal. Pupils are equal, round, and reactive to light.  Neck: Normal range of motion. Neck supple.  Cardiovascular: Normal rate and regular rhythm.   Pulmonary/Chest: Effort normal and breath sounds normal.  Abdominal: Soft. Bowel sounds are normal.  Musculoskeletal: Normal range of motion.  Neurological: She is alert.  Skin: Skin is warm and dry.  Psychiatric:  Flat affect  Nursing note and vitals reviewed.   ED Course  Procedures (including critical care time) Labs Review Labs Reviewed  COMPREHENSIVE METABOLIC PANEL - Abnormal; Notable for the following:    Alkaline Phosphatase 144 (*)    All other components within normal limits  LIPASE, BLOOD - Abnormal; Notable for the following:    Lipase 12 (*)    All other components within normal limits  URINALYSIS, ROUTINE W REFLEX MICROSCOPIC (NOT AT [facility]) - Abnormal; Notable for the following:    Specific Gravity, Urine >1.030 (*)    Hgb urine dipstick MODERATE (*)    Nitrite POSITIVE (*)    All other components within normal limits  URINE MICROSCOPIC-ADD ON - Abnormal; Notable for the following:    Squamous Epithelial / LPF FEW (*)    Bacteria, UA MANY (*)    All other components within normal limits  URINE CULTURE  TROPONIN I  LACTIC ACID, PLASMA  CBC WITH DIFFERENTIAL/PLATELET  LACTIC ACID, PLASMA    Imaging Review No results found.   EKG Interpretation   Date/Time:  Tuesday April 26 2015 15:22:17 EDT Ventricular Rate:  75 PR Interval:  161 QRS Duration: 85 QT Interval:  399 QTC Calculation: 446 R Axis:   -30 Text Interpretation:  Sinus rhythm Inferior infarct, old Baseline wander  in lead(s) V6 Confirmed by Nyrah Demos  MD, Oletta Buehring ([id number]) on 04/26/2015 4:04:13 PM      MDM    Final diagnoses:  Dementia, without behavioral disturbance  UTI (lower urinary tract infection)    Patient is difficult to assess. I suspect a partially treated urinary tract infection. IV fluids. IV Rocephin. Admit to general medicine.    Donnetta HutchingBrian Kahil Agner, MD 04/26/15 [phone]

## 2015-04-26 NOTE — ED Notes (Signed)
Pt was treated for UTI on 04/22/15 and pt has continued confusion and poor food intake.  Pt from [facility].

## 2015-04-26 NOTE — ED Notes (Signed)
[phone]

## 2015-04-26 NOTE — H&P (Signed)
[facility] History and Physical  Wilson Sample EAV:[id number] DOB: 20-Nov-1926 DOA: 04/26/2015  Referring physician: Dr Adriana Simas - [facility] PCP: Default, Provider, MD   Chief Complaint: Not acting herself  HPI: Michelle Escobar is a 81 y.o. female  Level V caveat: Patient with severe dementia and unable to provide reliable history. History provided by ER physician, nursing home staff member, and patient's daughter. Of note patient was seen at any pen ED on 04/22/2015 for complaints of decreased activity and oral intake. There is concern the patient had a UTI and she was started on ciprofloxacin. Since that time patient's condition is continued to worsen and nursing home staff states that patient has eaten very little over the last several days, seems weaker than normal, and is not as communicative as normal. Nursing staff also notes that patient's urine is very foul-smelling but denies hematuria, frequency, complaints of abdominal pain chest pain or headache or back pain. No vital sign instability of the nursing home. Patient has been given her ciprofloxacin as prescribed. No diarrhea.    Review of Systems:  Review of systems per history of present illness. No further review of systems able to be obtained due to patient's mental status.  Past Medical History  Diagnosis Date  . Dementia   . Bipolar 1 disorder   . Hyperlipemia   . Hypertension    History reviewed. No pertinent past surgical history. Social History:  reports that she has never smoked. She does not have any smokeless tobacco history on file. She reports that she does not drink alcohol or use illicit drugs.  No Known Allergies  History reviewed. No pertinent family history.   Prior to Admission medications   Medication Sig Start Date End Date Taking? Authorizing Provider  acetaminophen (TYLENOL) 500 MG tablet Take 500 mg by mouth 3 (three) times daily as needed. For pain/fever    Yes Historical Provider, MD  amLODipine  (NORVASC) 5 MG tablet Take 5 mg by mouth daily.     Yes Historical Provider, MD  ciprofloxacin (CIPRO) 250 MG tablet Take 1 tablet (250 mg total) by mouth 2 (two) times daily. 04/22/15  Yes Margarita Grizzle, MD  clotrimazole-betamethasone (LOTRISONE) cream Apply 1 application topically 2 (two) times daily as needed (dry skin).   Yes Historical Provider, MD  fluticasone (CUTIVATE) 0.05 % cream Apply 1 application topically 2 (two) times daily. To affected areas of face and ears   Yes Historical Provider, MD  folic acid (FOLVITE) 1 MG tablet Take 1 mg by mouth daily.   Yes Historical Provider, MD  gabapentin (NEURONTIN) 300 MG capsule Take 300 mg by mouth 3 (three) times daily.    Yes Historical Provider, MD  liver oil-zinc oxide (DESITIN) 40 % ointment Apply 1 application topically 3 (three) times daily as needed for irritation (irritation).   Yes Historical Provider, MD  loperamide (IMODIUM A-D) 2 MG tablet Take 1 tablet (2 mg total) by mouth 4 (four) times daily as needed for diarrhea or loose stools. 03/05/15  Yes Erick Blinks, MD  magnesium hydroxide (MILK OF MAGNESIA) 400 MG/5ML suspension Take 30 mLs by mouth daily as needed for mild constipation.   Yes Historical Provider, MD  mirtazapine (REMERON) 15 MG tablet Take 15 mg by mouth at bedtime.   Yes Historical Provider, MD  multivitamin-iron-minerals-folic acid (CENTRUM) chewable tablet Chew 1 tablet by mouth daily.   Yes Historical Provider, MD  nystatin cream (MYCOSTATIN) Apply 1 application topically as needed for dry skin (rash).   Yes  Historical Provider, MD  phenytoin (DILANTIN) 125 MG/5ML suspension Take 300 mg by mouth at bedtime.   Yes Historical Provider, MD  risperiDONE (RISPERDAL) 0.25 MG tablet Take 0.25 mg by mouth daily. Anxiety or agitation   Yes Historical Provider, MD  sertraline (ZOLOFT) 50 MG tablet Take 50 mg by mouth daily.   Yes Historical Provider, MD  simvastatin (ZOCOR) 20 MG tablet Take 20 mg by mouth at bedtime.     Yes  Historical Provider, MD  solifenacin (VESICARE) 5 MG tablet Take 5 mg by mouth at bedtime.   Yes Historical Provider, MD  traZODone (DESYREL) 50 MG tablet Take 50 mg by mouth at bedtime.    Yes Historical Provider, MD   Physical Exam: Filed Vitals:   04/26/15 1630 04/26/15 1730 04/26/15 1800 04/26/15 1815  BP: 133/89 134/69 125/69   Pulse:  68  70  Temp:      TempSrc:      Resp: 16 12 11 12   Height:      Weight:      SpO2:  97%  97%    Wt Readings from Last 3 Encounters:  04/26/15 56.7 kg (125 lb)  04/22/15 54.885 kg (121 lb)  03/02/15 55 kg (121 lb 4.1 oz)    General:  Appears calm and comfortable Eyes:  PERRL, normal lids, irises & conjunctiva ENT: Dry mucus membranes Neck:  no LAD, masses or thyromegaly Cardiovascular: faint heart sounds. Trace LE edema. RRR. No murmur Respiratory:  CTA bilaterally, no w/r/r. Normal respiratory effort. Abdomen:  soft, ntnd Skin:  no rash or induration seen on limited exam Musculoskeletal:  grossly normal tone BUE/BLE Psychiatric: Patient will attempt to answer questions but gives incoherent answers. Response to basic commands. Neurologic: Moves all extremities and coordinated fashion..          Labs on Admission:  Basic Metabolic Panel:  Recent Labs Lab 04/22/15 1929 04/26/15 1600  NA 147* 143  K 4.0 4.0  CL 111 105  CO2 29 32  GLUCOSE 137* 99  BUN 11 10  CREATININE 0.67 0.71  CALCIUM 9.5 9.3   Liver Function Tests:  Recent Labs Lab 04/22/15 1929 04/26/15 1600  AST 22 24  ALT 19 18  ALKPHOS 132* 144*  BILITOT 0.3 0.4  PROT 7.4 7.2  ALBUMIN 3.8 3.6    Recent Labs Lab 04/26/15 1600  LIPASE 12*   No results for input(s): AMMONIA in the last 168 hours. CBC:  Recent Labs Lab 04/22/15 1929 04/26/15 1600  WBC 5.1 5.7  NEUTROABS 2.8 3.0  HGB 12.6 13.1  HCT 38.4 39.2  MCV 85.7 86.0  PLT 161 155   Cardiac Enzymes:  Recent Labs Lab 04/26/15 1600  TROPONINI <0.03    BNP (last 3 results)  Recent  Labs  01/09/15 1418  BNP 25.0    ProBNP (last 3 results) No results for input(s): PROBNP in the last 8760 hours.  CBG: No results for input(s): GLUCAP in the last 168 hours.  Radiological Exams on Admission: No results found.    Assessment/Plan Principal Problem:   UTI (lower urinary tract infection) Active Problems:   Dementia   Bipolar disorder   HTN (hypertension), benign   Hyperlipemia   Change in mental status   Physical deconditioning   Bladder spasm   Neuropathy   Change in mental and physical status: Likely secondary to progressive dementia and UTI. Of note patient diagnosed with UTI on 04/22/2015 and prescribed ciprofloxacin. A culture was obtained at this time.  Based off of previous urine cultures from 03/04/2015 patient had Escherichia coli which was resistant to ciprofloxacin. Will resistance is noted ciprofloxacin was still somewhat of a reasonable choice as it concentrates in the bladder however it does not appear to be working at this point time. There is no evidence of pyelonephritis or urosepsis. Pt is AFVSS. Lactic acid 1.2. No .h/o fall or head trauma - MedSurg - Ceftriaxone - IVF - f/u [facility]X  Dementia: Lengthy discussion had with nursing home staff he states that patient's baseline is minimally communicative, follows basic commands, and readily eats. Discussed how patient's current status is likely affected by UTI may also be evidence of progressive dementia. Discussed this with patient's nursing home staff as well as daughter. - Meds as below  Physical deconditioning: Likely due to progressive dementia and current infection. - Ensure - PT/OT - Nutrition consult  Depression/Bipolar: - continue home Remeron, Dilantin, Zoloft, Risperdal - Dilantin level - continue Trazodone QHS  HLD: - continue statin  HTN: - continue Norvasc  Bladder spasm:  - continue vesicare  Neuropathy: - continue Neurontin   Code Status: FULL DVT Prophylaxis:  Hep Family Communication: Daughter - Will come by in the morning. Disposition Plan: Pending improvement   Michelle Escobar, Elmon ElseAVID J, MD Family Medicine [facility] [email or web address] Password [email or web address]

## 2015-04-27 DIAGNOSIS — E43 Unspecified severe protein-calorie malnutrition: Secondary | ICD-10-CM | POA: Insufficient documentation

## 2015-04-27 DIAGNOSIS — N390 Urinary tract infection, site not specified: Principal | ICD-10-CM

## 2015-04-27 DIAGNOSIS — R4182 Altered mental status, unspecified: Secondary | ICD-10-CM

## 2015-04-27 NOTE — Care Management Note (Signed)
Case Management Note  Patient Details  Name: Paul HalfRasheeda Kirtz MRN: [id number] Date of Birth: [birth year]  Expected Discharge Date:                  Expected Discharge Plan:  Assisted Living / Rest Home  In-House Referral:  Clinical Social Work  Discharge planning Services  CM Consult  Post Acute Care Choice:  NA Choice offered to:  NA  DME Arranged:    DME Agency:     HH Arranged:    HH Agency:     Status of Service:  In process, will continue to follow  Medicare Important Message Given:    Date Medicare IM Given:    Medicare IM give by:    Date Additional Medicare IM Given:    Additional Medicare Important Message give by:     If discussed at Long Length of Stay Meetings, dates discussed:    Additional Comments: Pt is from [facility]. [facility]. CSW is aware of admission and will arrange for return to facility if appropriate at DC. CM will cont to follow.   Malcolm Metrohildress, Chesky Heyer Demske, RN 04/27/2015, 4:13 PM

## 2015-04-27 NOTE — Progress Notes (Signed)
PROGRESS NOTE  Michelle Escobar ZOX:[id number] DOB: 08/26/1927 DOA: 04/26/2015 PCP: Default, Provider, MD  HPI: Michelle Escobar is a 81 y.o. female with severe dementia and unable to provide reliable history. History provided by ER physician, nursing home staff member, and patient's daughter. Of note patient was seen at any pen ED on 04/22/2015 for complaints of decreased activity and oral intake. There is concern the patient had a UTI and she was started on ciprofloxacin. Since that time patient's condition is continued to worsen and nursing home staff states that patient has eaten very little over the last several days, seems weaker than normal, and is not as communicative as normal.  Subjective / 24 H Interval events - demented, alert and awake but not answering any questions or making sense - not following commands - appears comfortable and content without acute distress  Assessment/Plan: Principal Problem:   UTI (lower urinary tract infection) Active Problems:   Dementia   Bipolar disorder   HTN (hypertension), benign   Hyperlipemia   Change in mental status   Physical deconditioning   Bladder spasm   Neuropathy   Protein-calorie malnutrition, severe   Acute encephalopathy on underlying dementia due to UTI -Likely secondary to progressive dementia and UTI. Of note patient diagnosed with UTI on 04/22/2015 and prescribed ciprofloxacin. A culture was obtained at this time. Based off of previous urine cultures from 03/04/2015 patient had Escherichia coli which was resistant to ciprofloxacin. - ceftriaxone - urine culture with E coli, final result pending  Dementia:  -  patient's baseline is minimally communicative, follows basic commands, and readily eats.   Physical deconditioning:  - Likely due to progressive dementia and current infection. - Ensure - PT/OT - Nutrition consult  Depression/Bipolar: - continue home Remeron, Dilantin, Zoloft, Risperdal - Dilantin level -  continue Trazodone QHS  HLD: - continue statin  HTN: - continue Norvasc  Bladder spasm:  - continue vesicare  Neuropathy: - continue Neurontin   Diet:   Fluids: NS DVT Prophylaxis: heparin  Code Status: Full Code Family Communication: no family bedside  Disposition Plan: SNF when ready   Consultants:  None   Procedures:  None    Antibiotics Ceftriaxone 7/12 >>   Studies  No results found.  Objective  Filed Vitals:   04/26/15 1930 04/26/15 2044 04/27/15 0500 04/27/15 1510  BP: 133/79 148/70 123/67 161/63  Pulse: 77 68 78 80  Temp:  97.8 F (36.6 C) 97.8 F (36.6 C) 97.9 F (36.6 C)  TempSrc:  Oral Axillary Axillary  Resp: Height:   (1.499 m)    Weight:  119 lb 8 oz (54.205 kg)    SpO2: 98% 100% 99% 100%    Intake/Output Summary (Last 24 hours) at 04/27/15 1613 Last data filed at 04/27/15 8119  Gross per 24 hour  Intake    737 ml  Output      0 ml  Net    737 ml   Filed Weights   04/26/15 1433 04/26/15 2044  Weight: 125 lb (56.7 kg) 119 lb 8 oz (54.205 kg)   Exam:  General:  NAD, alert, demented  HEENT: no scleral icterus, PERRL  Cardiovascular: RRR without MRG, 2+ peripheral pulses, no edema  Respiratory: CTA biL, good air movement, no wheezing  Abdomen: soft, non tender, BS +, no guarding  MSK/Extremities: no clubbing/cyanosis, no joint swelling  Skin: no rashes  Neuro: does not follow commands  Data Reviewed: Basic Metabolic Panel:  Recent  Labs Lab 04/22/15 1929 04/26/15 1600  NA 147* 143  K 4.0 4.0  CL 111 105  CO2 29 32  GLUCOSE 137* 99  BUN 11 10  CREATININE 0.67 0.71  CALCIUM 9.5 9.3   Liver Function Tests:  Recent Labs Lab 04/22/15 1929 04/26/15 1600  AST 22 24  ALT 19 18  ALKPHOS 132* 144*  BILITOT 0.3 0.4  PROT 7.4 7.2  ALBUMIN 3.8 3.6    Recent Labs Lab 04/26/15 1600  LIPASE 12*   CBC:  Recent Labs Lab 04/22/15 1929 04/26/15 1600  WBC 5.1 5.7  NEUTROABS 2.8 3.0    HGB 12.6 13.1  HCT 38.4 39.2  MCV 85.7 86.0  PLT 161 155   Cardiac Enzymes:  Recent Labs Lab 04/26/15 1600  TROPONINI <0.03   BNP (last 3 results)  Recent Labs  01/09/15 1418  BNP 25.0    Recent Results (from the past 240 hour(s))  Urine culture     Status: None (Preliminary result)   Collection Time: 04/26/15  6:40 PM  Result Value Ref Range Status   Specimen Description URINE, CATHETERIZED  Final   Special Requests IMMUNE:COMPROMISED  Final   Culture   Final    >=100,000 COLONIES/mL ESCHERICHIA COLI [facility]    Report Status PENDING  Incomplete     Scheduled Meds: . amLODipine  5 mg Oral Daily  . cefTRIAXone (ROCEPHIN)  IV  1 g Intravenous Q24H  . darifenacin  7.5 mg Oral Daily  . feeding supplement (ENSURE ENLIVE)  237 mL Oral TID BM  . gabapentin  300 mg Oral TID  . heparin  5,000 Units Subcutaneous 3 times per day  . mirtazapine  15 mg Oral QHS  . phenytoin  300 mg Oral QHS  . risperiDONE  0.25 mg Oral Daily  . sertraline  50 mg Oral Daily  . simvastatin  20 mg Oral QHS  . traZODone  50 mg Oral QHS   Continuous Infusions: . sodium chloride 75 mL/hr (04/27/15 0012)    Pamella Pertostin Quintana Canelo, MD [facility] Pager [phone]. If 7 PM - 7 AM, please contact night-coverage at [email or web address], password [email or web address] 04/27/2015, 4:13 PM  LOS: 1 day

## 2015-04-27 NOTE — Progress Notes (Signed)
PT Cancellation Note  Patient Details Name: Michelle Escobar MRN: [id number] DOB: 12/12/1926   Cancelled Treatment:    Reason Eval/Treat Not Completed: Other (comment). Pt has been previously screen for PT needs in May and found to be inappropriate for PT services based on inability to follow commands consisitently, as well as agitation with attempts to perform evaluation. Pt at baseline has adequate caregiver assistance. Will attempt to contact daughter again to ascertain any updates to patient functional mobility or caregiver goals. Holding PT eval at this time.   Buccola,Allan C 04/27/2015, 1:14 PM  1:17 PM  Rosamaria LintsAllan C Buccola, PT, DPT [place] License # [id number]

## 2015-04-27 NOTE — Progress Notes (Signed)
Initial Nutrition Assessment  DOCUMENTATION CODES:   Severe malnutrition in context of acute illness/injury  INTERVENTION:  Ensure Enlive po BID, each supplement provides 350 kcal and 20 grams of protein   RD will continue to follow   NUTRITION DIAGNOSIS:   Inadequate oral intake related to acute illness, chronic illness (UTI and progression of dementia) as evidenced by energy intake < or equal to 50% for > or equal to 5 days, moderate depletions of muscle mass and wt loss over time (7%) from March to May.   GOAL:   Patient will meet greater than or equal to 90% of their needs   MONITOR:   PO intake, Supplement acceptance, Labs, Weight trends  REASON FOR ASSESSMENT:   Consult Assessment of nutrition requirement/status  ASSESSMENT:  Pt unable to provide hx. Presents with AMS, UTI. Severe malnutr in setting of acute illness and progression of dementia.  Diet Order:   Regular  Skin:   WDL  Last BM:   PTA  Height:   Ht Readings from Last 1 Encounters:  04/26/15 4\' 11"  (1.499 m)    Weight:   Wt Readings from Last 1 Encounters:  04/26/15 119 lb 8 oz (54.205 kg)    Ideal Body Weight:  44.6 kg  Wt Readings from Last 10 Encounters:  04/26/15 119 lb 8 oz (54.205 kg)  04/22/15 121 lb (54.885 kg)  03/02/15 121 lb 4.1 oz (55 kg)  01/09/15 130 lb (58.968 kg)  11/30/14 130 lb (58.968 kg)  05/04/11 134 lb 14.7 oz (61.2 kg)    BMI:  Body mass index is 24.12 kg/(m^2).  Estimated Nutritional Needs:   Kcal:  1350-1620  Protein:  65-75 gr  Fluid:  >1.5 liters daily  EDUCATION NEEDS: not appropriate, pt unable to participate    Royann ShiversLynn Rhealyn Cullen MS,RD,CSG,LDN Office: [phone] Pager: [phone]

## 2015-04-27 NOTE — Discharge Instructions (Addendum)
Nutrition Post Hospital Stay Proper nutrition can help your body recover from illness and injury.   Foods and beverages high in protein, vitamins, and minerals help rebuild muscle loss, promote healing, & reduce fall risk.   In addition to eating healthy foods, a nutrition shake is an easy, delicious way to get the nutrition you need during and after your hospital stay  It is recommended that you continue to drink 2 bottles per day of:   Ensure BID for at least 1 month (30 days) after your hospital stay   Tips for adding a nutrition shake into your routine: As allowed, drink one with vitamins or medications instead of water or juice Enjoy one as a tasty mid-morning or afternoon snack Drink cold or make a milkshake out of it Drink one instead of milk with cereal or snacks Use as a coffee creamer   Available at the following grocery stores and pharmacies:           * Karin Golden * [employer] * [employer]  * R[employer] Aid          * Walmart * [employer]  * Wal[facility]reens      * Target  * BJ's   * CVS  * [employer]   * [facility] [phone]            For COUPONS visit: www.ensure.com/join or RoleLink.com.br   Suggested Substitutions Ensure Plus = Boost Plus = Carnation Breakfast Essentials = Boost Compact Ensure Active Clear = Boost Breeze Glucerna Shake = Boost Glucose Control = Carnation Breakfast Essentials SUGAR FREE      Follow with primary MD in 5-7 days  Please get a complete blood count and chemistry panel checked by your Primary MD at your next visit, and again as instructed by your Primary MD. Please get your medications reviewed and adjusted by your Primary MD.  Please request your Primary MD to go over all Hospital Tests and Procedure/Radiological results at the follow up, please get all Hospital records sent to your Prim MD by signing hospital release before you go home.  If you had Pneumonia of Lung problems at the Hospital: Please get a 2  view Chest X ray done in 6-8 weeks after hospital discharge or sooner if instructed by your Primary MD.  If you have Congestive Heart Failure: Please call your Cardiologist or Primary MD anytime you have any of the following symptoms:  1) 3 pound weight gain in 24 hours or 5 pounds in 1 week  2) shortness of breath, with or without a dry hacking cough  3) swelling in the hands, feet or stomach  4) if you have to sleep on extra pillows at night in order to breathe  Follow cardiac low salt diet and 1.5 lit/day fluid restriction.  If you have diabetes Accuchecks 4 times/day, Once in AM empty stomach and then before each meal. Log in all results and show them to your primary doctor at your next visit. If any glucose reading is under 80 or above 300 call your primary MD immediately.  If you have Seizure/Convulsions/Epilepsy: Please do not drive, operate heavy machinery, participate in activities at heights or participate in high speed sports until you have seen by Primary MD or a Neurologist and advised to do so again.  If you had Gastrointestinal Bleeding: Please ask your Primary MD to check a complete blood count within one week of discharge or at your next visit. Your endoscopic/colonoscopic biopsies that  are pending at the time of discharge, will also need to followed by your Primary MD.  Get Medicines reviewed and adjusted. Please take all your medications with you for your next visit with your Primary MD  Please request your Primary MD to go over all hospital tests and procedure/radiological results at the follow up, please ask your Primary MD to get all Hospital records sent to his/her office.  If you experience worsening of your admission symptoms, develop shortness of breath, life threatening emergency, suicidal or homicidal thoughts you must seek medical attention immediately by calling 911 or calling your MD immediately  if symptoms less severe.  You must read complete  instructions/literature along with all the possible adverse reactions/side effects for all the Medicines you take and that have been prescribed to you. Take any new Medicines after you have completely understood and accpet all the possible adverse reactions/side effects.   Do not drive or operate heavy machinery when taking Pain medications.   Do not take more than prescribed Pain, Sleep and Anxiety Medications  Special Instructions: If you have smoked or chewed Tobacco  in the last 2 yrs please stop smoking, stop any regular Alcohol  and or any Recreational drug use.  Wear Seat belts while driving.  Please note You were cared for by a hospitalist during your hospital stay. If you have any questions about your discharge medications or the care you received while you were in the hospital after you are discharged, you can call the unit and asked to speak with the hospitalist on call if the hospitalist that took care of you is not available. Once you are discharged, your primary care physician will handle any further medical issues. Please note that NO REFILLS for any discharge medications will be authorized once you are discharged, as it is imperative that you return to your primary care physician (or establish a relationship with a primary care physician if you do not have one) for your aftercare needs so that they can reassess your need for medications and monitor your lab values.  You can reach the hospitalist office at phone [phone] or fax [phone]   If you do not have a primary care physician, you can call [phone] for a physician referral.  Activity: As tolerated with Full fall precautions use walker/cane & assistance as needed  Diet: as tolerated  Disposition SNF

## 2015-04-27 NOTE — Clinical Social Work Note (Signed)
Clinical Social Work Assessment  Patient Details  Name: Michelle Escobar MRN: [id number] Date of Birth: Jan 14, 1927  Date of referral:  04/27/15               Reason for consult:  Facility Placement                Permission sought to share information with:    Permission granted to share information::     Name::     Hospital doctorsmat  Agency::     Relationship::  guardian  Contact Information:     Housing/Transportation Living arrangements for the past 2 months:  Assisted Living Facility Source of Information:  Adult Children Patient Interpreter Needed:  None Criminal Activity/Legal Involvement Pertinent to Current Situation/Hospitalization:  No - Comment as needed Significant Relationships:  Adult Children Lives with:  Facility Resident Do you feel safe going back to the place where you live?  Yes Need for family participation in patient care:  Yes (Comment)  Care giving concerns:  Pt is long term resident at family care home.    Social Worker assessment / plan:  CSW spoke with pt's daughter, Thayer Headingssmat on phone as pt is disoriented x4. Ismat reports that she is guardian. Daughter is very involved and sees pt regularly. Pt has been a [facility] for over 4 years. Ismat has been very pleased with care at facility and states, "They love her like family." She became somewhat emotional as she shared that she hopes pt can return to facility at d/c. Per Crystal, supervisor-in-charge at facility, pt ambulates with a walker and requires assist with all ADLs, including feeding. No home health prior to admission and pt is okay to return. CSW will continue to keep facility updated on pt with daughter's permission.   Employment status:  Retired Database administratornsurance information:  Managed Medicare PT Recommendations:  Not assessed at this time Information / Referral to community resources:  Other (Comment Required) (return to [facility])  Patient/Family's Response to care:  Pt's daughter indicates she is  "on edge" as she is worried about her mother and wants her to be able to [facility]. Support provided.   Patient/Family's Understanding of and Emotional Response to Diagnosis, Current Treatment, and Prognosis:  Pt's daughter is very aware of pt's medical history and admission diagnosis. Daughter states she has come around quickly after UTI's in the past. She has said that she eventually plans to take her mother home with her as she approaches end of life.   Emotional Assessment Appearance:  Appears stated age Attitude/Demeanor/Rapport:  Unable to Assess Affect (typically observed):  Unable to Assess Orientation:    Alcohol / Substance use:  Not Applicable Psych involvement (Current and /or in the community):  No (Comment)  Discharge Needs  Concerns to be addressed:  Discharge Planning Concerns Readmission within the last 30 days:  No Current discharge risk:  Cognitively Impaired Barriers to Discharge:  Continued Medical Work up   De[employer] Foods CompanyStultz, Dover CorporationKara Shanaberger, LCSW 04/27/2015, 10:38 AM [phone]

## 2015-04-27 NOTE — Progress Notes (Signed)
OT Screen  Patient Details Name: Michelle Escobar MRN: [id number] DOB: 1926-12-19   OT Screen:     Reason evaluation not completed: Pt screened for OT needs. Pt unwilling to engage with OTR this am. Per chart review, pt living at ALF and receiving assistance for BADL needs. Pt is able to follow basic commands. Pt appears to be at baseline with BADL tasks & assistance needed, no further OT services at this time.   Ezra SitesLeslie Laurey Salser, OTR/L  [phone] 04/27/2015, 8:49 AM

## 2015-04-28 LAB — URINE CULTURE: Culture: >=100000

## 2015-04-28 LAB — CLOSTRIDIUM DIFFICILE BY PCR: Toxigenic C. Difficile by PCR: NEGATIVE

## 2015-04-28 NOTE — Progress Notes (Signed)
PROGRESS NOTE  Michelle HalfRasheeda Keithley UJW:[id number] DOB: 05/17/1927 DOA: 04/26/2015 PCP: Default, Provider, MD  HPI: Michelle Escobar is a 81 y.o. female with severe dementia and unable to provide reliable history. History provided by ER physician, nursing home staff member, and patient's daughter. Of note patient was seen at any pen ED on 04/22/2015 for complaints of decreased activity and oral intake. There is concern the patient had a UTI and she was started on ciprofloxacin. Since that time patient's condition is continued to worsen and nursing home staff states that patient has eaten very little over the last several days, seems weaker than normal, and is not as communicative as normal.  Subjective / 24 H Interval events - demented, alert and awake, more interactive today - still not following commands  Assessment/Plan: Principal Problem:   UTI (lower urinary tract infection) Active Problems:   Dementia   Bipolar disorder   HTN (hypertension), benign   Hyperlipemia   Change in mental status   Physical deconditioning   Bladder spasm   Neuropathy   Protein-calorie malnutrition, severe   Acute encephalopathy on underlying dementia due to UTI -Likely secondary to progressive dementia and UTI. Of note patient diagnosed with UTI on 04/22/2015 and prescribed ciprofloxacin. A culture was obtained at this time. Based off of previous urine cultures from 03/04/2015 patient had Escherichia coli which was resistant to ciprofloxacin. - ceftriaxone - urine culture with E coli with micro as below  Dementia:  - patient's baseline is minimally communicative, follows basic commands, and readily eats.  - getting close to baseline per daughter   Physical deconditioning:  - Likely due to progressive dementia and current infection. - Ensure - PT/OT - Nutrition consult  Depression/Bipolar: - continue home Remeron, Dilantin, Zoloft, Risperdal - Dilantin level - continue Trazodone QHS  HLD: -  continue statin  HTN: - continue Norvasc  Bladder spasm:  - continue vesicare  Neuropathy: - continue Neurontin   Diet: DIET DYS 2 Room service appropriate?: Yes; Fluid consistency:: Thin Fluids: NS DVT Prophylaxis: heparin  Code Status: Full Code Family Communication: d/w daughter over the phone  Disposition Plan: SNF when ready   Consultants:  None   Procedures:  None    Antibiotics Ceftriaxone 7/12 >>   Studies  No results found.  Objective  Filed Vitals:   04/27/15 1510 04/27/15 2020 04/28/15 0450 04/28/15 1325  BP: 161/63 126/79 126/74 106/80  Pulse: 80 103 90 80  Temp: 97.9 F (36.6 C) 99.5 F (37.5 C) 97.9 F (36.6 C) 98.1 F (36.7 C)  TempSrc: Axillary Oral Axillary Axillary  Resp: 16 16 15 16   Height:      Weight:      SpO2: 100% 98% 100% 98%    Intake/Output Summary (Last 24 hours) at 04/28/15 1511 Last data filed at 04/28/15 1000  Gross per 24 hour  Intake   2675 ml  Output      0 ml  Net   2675 ml   Filed Weights   04/26/15 1433 04/26/15 2044  Weight: 56.7 kg (125 lb) 54.205 kg (119 lb 8 oz)   Exam:  General:  NAD, alert, demented  HEENT: no scleral icterus, PERRL  Cardiovascular: RRR without MRG, 2+ peripheral pulses, no edema  Respiratory: CTA biL, good air movement, no wheezing  Abdomen: soft, non tender, BS +, no guarding  MSK/Extremities: no clubbing/cyanosis, no joint swelling  Skin: no rashes  Neuro: does not follow commands  Data Reviewed: Basic Metabolic Panel:  Recent Labs  Lab 04/22/15 1929 04/26/15 1600  NA 147* 143  K 4.0 4.0  CL 111 105  CO2 29 32  GLUCOSE 137* 99  BUN 11 10  CREATININE 0.67 0.71  CALCIUM 9.5 9.3   Liver Function Tests:  Recent Labs Lab 04/22/15 1929 04/26/15 1600  AST 22 24  ALT 19 18  ALKPHOS 132* 144*  BILITOT 0.3 0.4  PROT 7.4 7.2  ALBUMIN 3.8 3.6    Recent Labs Lab 04/26/15 1600  LIPASE 12*   CBC:  Recent Labs Lab 04/22/15 1929 04/26/15 1600  WBC  5.1 5.7  NEUTROABS 2.8 3.0  HGB 12.6 13.1  HCT 38.4 39.2  MCV 85.7 86.0  PLT 161 155   Cardiac Enzymes:  Recent Labs Lab 04/26/15 1600  TROPONINI <0.03   BNP (last 3 results)  Recent Labs  01/09/15 1418  BNP 25.0    Recent Results (from the past 240 hour(s))  Urine culture     Status: None   Collection Time: 04/26/15  6:40 PM  Result Value Ref Range Status   Specimen Description URINE, CATHETERIZED  Final   Special Requests IMMUNE:COMPROMISED  Final   Culture   Final    >=100,000 COLONIES/mL ESCHERICHIA COLI Performed at [facility]    Report Status 04/28/2015 FINAL  Final   Organism ID, Bacteria ESCHERICHIA COLI  Final      Susceptibility   Escherichia coli - MIC*    AMPICILLIN <=2 SENSITIVE Sensitive     CEFAZOLIN <=4 SENSITIVE Sensitive     CEFTRIAXONE <=1 SENSITIVE Sensitive     CIPROFLOXACIN >=4 RESISTANT Resistant     GENTAMICIN <=1 SENSITIVE Sensitive     IMIPENEM <=0.25 SENSITIVE Sensitive     NITROFURANTOIN <=16 SENSITIVE Sensitive     TRIMETH/SULFA <=20 SENSITIVE Sensitive     AMPICILLIN/SULBACTAM <=2 SENSITIVE Sensitive     PIP/TAZO <=4 SENSITIVE Sensitive     * >=100,000 COLONIES/mL ESCHERICHIA COLI  Clostridium Difficile by PCR (not at [facility])     Status: None   Collection Time: 04/28/15  6:03 AM  Result Value Ref Range Status   C difficile by pcr NEGATIVE NEGATIVE Final     Scheduled Meds: . amLODipine  5 mg Oral Daily  . cefTRIAXone (ROCEPHIN)  IV  1 g Intravenous Q24H  . darifenacin  7.5 mg Oral Daily  . feeding supplement (ENSURE ENLIVE)  237 mL Oral TID BM  . gabapentin  300 mg Oral TID  . heparin  5,000 Units Subcutaneous 3 times per day  . mirtazapine  15 mg Oral QHS  . phenytoin  300 mg Oral QHS  . risperiDONE  0.25 mg Oral Daily  . sertraline  50 mg Oral Daily  . simvastatin  20 mg Oral QHS  . traZODone  50 mg Oral QHS   Continuous Infusions: . sodium chloride 75 mL/hr at 04/28/15 0526    Pamella Pert, MD [facility] Pager [phone]. If 7 PM - 7 AM, please contact night-coverage at [email or web address], password [facility] 04/28/2015, 3:11 PM  LOS: 2 days

## 2015-04-28 NOTE — Evaluation (Signed)
Physical Therapy Evaluation Patient Details Name: Heavin Sebree MRN: [id number] DOB: July 05, 1927 Today's Date: 04/28/2015   History of Present Illness  Level V caveat: Patient with severe dementia and unable to provide reliable history. History provided by ER physician, nursing home staff member, and patient's daughter. Of note patient was seen at any pen ED on 04/22/2015 for complaints of decreased activity and oral intake. There is concern the patient had a UTI and she was started on ciprofloxacin. Since that time patient's condition is continued to worsen and nursing home staff states that patient has eaten very little over the last several days, seems weaker than normal, and is not as communicative as normal. Nursing staff also notes that patient's urine is very foul-smelling but denies hematuria, frequency, complaints of abdominal pain chest pain or headache or back pain. No vital sign instability of the nursing home. Patient has been given her ciprofloxacin as prescribed. No diarrhea.  Clinical Impression  Pt was seen for evaluation.  She was awakened from sleep but able to stay awake.  She speaks nonsensically and is unable to follow directions.  She vigorously resists all movement by therapist demonstrating good strength.  She required total to max assist to stand with a walker and was unable to take any steps.  In my opinion, her lack of mobility stems from her inability to understand her surroundings.  Once she returns to her normal home environment she should return to her baseline.    Follow Up Recommendations No PT follow up    Equipment Recommendations  None recommended by PT    Recommendations for Other Services   none    Precautions / Restrictions Precautions Precautions: Fall Restrictions Weight Bearing Restrictions: No      Mobility  Bed Mobility Overal bed mobility: Needs Assistance Bed Mobility: Supine to Sit     Supine to sit: Max assist         Transfers Overall transfer level: Needs assistance Equipment used: Rolling walker (2 wheeled) Transfers: Sit to/from [employer] Sit to Stand: Max assist Stand pivot transfers: Total assist       General transfer comment: pt is able to stand with walker, mod assist to maintain stance...unable to take any steps  Ambulation/Gait  unable                                 Balance Overall balance assessment: Needs assistance Sitting-balance support: No upper extremity supported;Feet supported Sitting balance-Leahy Scale: Fair     Standing balance support: Bilateral upper extremity supported Standing balance-Leahy Scale: Poor                               Pertinent Vitals/Pain Pain Assessment: Faces Pain Score: 0-No pain    Home Living Family/patient expects to be discharged to:: Assisted living                 Additional Comments: unknown    Prior Function Level of Independence: Needs assistance   Gait / Transfers Assistance Needed: pt was able to ambulate and transfer using a walker and assist  ADL's / Homemaking Assistance Needed: full assist with all ADLs due to severe dementia        Hand Dominance        Extremity/Trunk Assessment               Lower Extremity  Assessment: Overall WFL for tasks assessed (pt resists all attempted movement by PT with vigorous strength)      Cervical / Trunk Assessment: Kyphotic  Communication   Communication: Receptive difficulties;Expressive difficulties (severe dementia with inability to communicate)  Cognition Arousal/Alertness: Awake/alert Behavior During Therapy: Flat affect Overall Cognitive Status: History of cognitive impairments - at baseline                      General Comments      Exercises        Assessment/Plan    PT Assessment Patent does not need any further PT services  PT Diagnosis     PT Problem List    PT Treatment  Interventions     PT Goals (Current goals can be found in the Care Plan section) Acute Rehab PT Goals PT Goal Formulation: All assessment and education complete, DC therapy    Frequency     Barriers to discharge        Co-evaluation               End of Session Equipment Utilized During Treatment: Gait belt Activity Tolerance: Patient tolerated treatment well Patient left: in bed;with call bell/phone within reach;with bed alarm set      Functional Assessment Tool Used: clinical judgement Functional Limitation: Mobility: Walking and moving around Mobility: Walking and Moving Around Current Status ([id number]): At least 60 percent but less than 80 percent impaired, limited or restricted Mobility: Walking and Moving Around Goal Status [phone]([id number]): At least 60 percent but less than 80 percent impaired, limited or restricted Mobility: Walking and Moving Around Discharge Status [phone](G8980): At least 60 percent but less than 80 percent impaired, limited or restricted    Time: 0839-0912 PT Time Calculation (min) (ACUTE ONLY): 33 min   Charges:   PT Evaluation $Initial PT Evaluation Tier I: 1 Procedure     PT G Codes:   PT G-Codes **NOT FOR INPATIENT CLASS** Functional Assessment Tool Used: clinical judgement Functional Limitation: Mobility: Walking and moving around Mobility: Walking and Moving Around Current Status ([id number]): At least 60 percent but less than 80 percent impaired, limited or restricted Mobility: Walking and Moving Around Goal Status [phone]): At least 60 percent but less than 80 percent impaired, limited or restricted Mobility: Walking and Moving Around Discharge Status [phone]([id number]): At least 60 percent but less than 80 percent impaired, limited or restricted    Konrad PentaBrown, Kru Allman L  PT 04/28/2015, 9:17 AM [phone]

## 2015-04-29 DIAGNOSIS — E43 Unspecified severe protein-calorie malnutrition: Secondary | ICD-10-CM

## 2015-04-29 DIAGNOSIS — F0390 Unspecified dementia without behavioral disturbance: Secondary | ICD-10-CM

## 2015-04-29 MED ORDER — NITROFURANTOIN MONOHYD MACRO 100 MG PO CAPS: 100.0000 mg | ORAL_CAPSULE | Freq: Two times a day (BID) | ORAL | Status: AC

## 2015-04-29 NOTE — Discharge Summary (Signed)
Physician Discharge Summary  Michelle HalfRasheeda Walrath ZOX:[id number] DOB: 13-Apr-1927 DOA: 04/26/2015  PCP: Default, Provider, MD  Admit date: 04/26/2015 Discharge date: 04/29/2015  Time spent: > 35 minutes  Recommendations for Outpatient Follow-up:  1. Follow up with SNF MD in 1 week 2. Continue Macrobid as prescribed for 4 additional days   Discharge Diagnoses:  Principal Problem:   UTI (lower urinary tract infection) Active Problems:   Dementia   Bipolar disorder   HTN (hypertension), benign   Hyperlipemia   Change in mental status   Physical deconditioning   Bladder spasm   Neuropathy   Protein-calorie malnutrition, severe  Discharge Condition: stable  Filed Weights   04/26/15 1433 04/26/15 2044  Weight: 56.7 kg (125 lb) 54.205 kg (119 lb 8 oz)    History of present illness:  Michelle Escobar is a 81 y.o. female Level V caveat: Patient with severe dementia and unable to provide reliable history. History provided by ER physician, nursing home staff member, and patient's daughter. Of note patient was seen at any pen ED on 04/22/2015 for complaints of decreased activity and oral intake. There is concern the patient had a UTI and she was started on ciprofloxacin. Since that time patient's condition is continued to worsen and nursing home staff states that patient has eaten very little over the last several days, seems weaker than normal, and is not as communicative as normal. Nursing staff also notes that patient's urine is very foul-smelling but denies hematuria, frequency, complaints of abdominal pain chest pain or headache or back pain. No vital sign instability of the nursing home. Patient has been given her ciprofloxacin as prescribed. No diarrhea.  Hospital Course:  Acute encephalopathy on underlying dementia due to UTI -Likely secondary to progressive dementia and UTI. Of note patient diagnosed with UTI on 04/22/2015 and prescribed ciprofloxacin. A culture was obtained at this time.  Based off of previous urine cultures from 03/04/2015 patient had Escherichia coli which was resistant to ciprofloxacin. She was initially started on Ceftriaxone and urine culture was repeated; microbiology showed E coli with sensitivities as below. She will be discharged to continue treatment with Macrobid for 4 additional days. Her mental status has improved and per daughter she is close to baseline.  Dementia: - patient's baseline is minimally communicative, follows basic commands, and readily eats.  Physical deconditioning: - Likely due to progressive dementia and current infection, improving Depression/Bipolar - continue her home regimen HLD- continue statin HTN - continue Norvasc Bladder spasm- continue vesicare Neuropathy - continue Neurontin  Procedures:  None    Consultations:  None   Discharge Exam: Filed Vitals:   04/27/15 2020 04/28/15 0450 04/28/15 1325 04/29/15 0600  BP: 126/79 126/74 106/80 147/72  Pulse: 103 90 80 74  Temp: 99.5 F (37.5 C) 97.9 F (36.6 C) 98.1 F (36.7 C) 97.5 F (36.4 C)  TempSrc: Oral Axillary Axillary Axillary  Resp: 16 15 16 18   Height:      Weight:      SpO2: 98% 100% 98% 98%   General: NAD Cardiovascular: RRR Respiratory: CTA biL  Discharge Instructions     Medication List    TAKE these medications        acetaminophen 500 MG tablet  Commonly known as:  TYLENOL  Take 500 mg by mouth 3 (three) times daily as needed. For pain/fever     amLODipine 5 MG tablet  Commonly known as:  NORVASC  Take 5 mg by mouth daily.     clotrimazole-betamethasone cream  Commonly known as:  LOTRISONE  Apply 1 application topically 2 (two) times daily as needed (dry skin).     fluticasone 0.05 % cream  Commonly known as:  CUTIVATE  Apply 1 application topically 2 (two) times daily. To affected areas of face and ears     folic acid 1 MG tablet  Commonly known as:  FOLVITE  Take 1 mg by mouth daily.     gabapentin 300 MG capsule    Commonly known as:  NEURONTIN  Take 300 mg by mouth 3 (three) times daily.     liver oil-zinc oxide 40 % ointment  Commonly known as:  DESITIN  Apply 1 application topically 3 (three) times daily as needed for irritation (irritation).     loperamide 2 MG tablet  Commonly known as:  IMODIUM A-D  Take 1 tablet (2 mg total) by mouth 4 (four) times daily as needed for diarrhea or loose stools.     magnesium hydroxide 400 MG/5ML suspension  Commonly known as:  MILK OF MAGNESIA  Take 30 mLs by mouth daily as needed for mild constipation.     mirtazapine 15 MG tablet  Commonly known as:  REMERON  Take 15 mg by mouth at bedtime.     multivitamin-iron-minerals-folic acid chewable tablet  Chew 1 tablet by mouth daily.     nitrofurantoin (macrocrystal-monohydrate) 100 MG capsule  Commonly known as:  MACROBID  Take 1 capsule (100 mg total) by mouth 2 (two) times daily.     nystatin cream  Commonly known as:  MYCOSTATIN  Apply 1 application topically as needed for dry skin (rash).     phenytoin 125 MG/5ML suspension  Commonly known as:  DILANTIN  Take 300 mg by mouth at bedtime.     risperiDONE 0.25 MG tablet  Commonly known as:  RISPERDAL  Take 0.25 mg by mouth daily. Anxiety or agitation     sertraline 50 MG tablet  Commonly known as:  ZOLOFT  Take 50 mg by mouth daily.     simvastatin 20 MG tablet  Commonly known as:  ZOCOR  Take 20 mg by mouth at bedtime.     solifenacin 5 MG tablet  Commonly known as:  VESICARE  Take 5 mg by mouth at bedtime.     traZODone 50 MG tablet  Commonly known as:  DESYREL  Take 50 mg by mouth at bedtime.           Follow-up Information    Follow up with SNF MD. Schedule an appointment as soon as possible for a visit in 1 week.      The results of significant diagnostics from this hospitalization (including imaging, microbiology, ancillary and laboratory) are listed below for reference.    Significant Diagnostic Studies: Ct Head Wo  Contrast  04/22/2015   CLINICAL DATA:  Acute onset of weakness and dizziness. Loss of consciousness. Initial encounter.  EXAM: CT HEAD WITHOUT CONTRAST  TECHNIQUE: Contiguous axial images were obtained from the base of the skull through the vertex without intravenous contrast.  COMPARISON:  CT of the head performed 01/09/2015  FINDINGS: There is no evidence of acute infarction, mass lesion, or intra- or extra-axial hemorrhage on CT.  Prominence of the ventricles and sulci reflects moderately severe cortical volume loss. Cerebellar atrophy is noted. Scattered periventricular white matter change likely reflects small vessel ischemic microangiopathy.  The brainstem and fourth ventricle are within normal limits. The basal ganglia are unremarkable in appearance. The cerebral hemispheres demonstrate grossly normal  gray-white differentiation. No mass effect or midline shift is seen.  There is no evidence of fracture; visualized osseous structures are unremarkable in appearance. The orbits are within normal limits. The paranasal sinuses and mastoid air cells are well-aerated. No significant soft tissue abnormalities are seen.  IMPRESSION: 1. No acute intracranial pathology seen on CT. 2. Moderately severe cortical volume loss and scattered small vessel ischemic microangiopathy.   Electronically Signed   By: Roanna Raider M.D.   On: 04/22/2015 21:15    Microbiology: Recent Results (from the past 240 hour(s))  Urine culture     Status: None   Collection Time: 04/26/15  6:40 PM  Result Value Ref Range Status   Specimen Description URINE, CATHETERIZED  Final   Special Requests IMMUNE:COMPROMISED  Final   Culture   Final    >=100,000 COLONIES/mL ESCHERICHIA COLI Performed at [facility]    Report Status 04/28/2015 FINAL  Final   Organism ID, Bacteria ESCHERICHIA COLI  Final      Susceptibility   Escherichia coli - MIC*    AMPICILLIN <=2 SENSITIVE Sensitive     CEFAZOLIN <=4 SENSITIVE Sensitive      CEFTRIAXONE <=1 SENSITIVE Sensitive     CIPROFLOXACIN >=4 RESISTANT Resistant     GENTAMICIN <=1 SENSITIVE Sensitive     IMIPENEM <=0.25 SENSITIVE Sensitive     NITROFURANTOIN <=16 SENSITIVE Sensitive     TRIMETH/SULFA <=20 SENSITIVE Sensitive     AMPICILLIN/SULBACTAM <=2 SENSITIVE Sensitive     PIP/TAZO <=4 SENSITIVE Sensitive     * >=100,000 COLONIES/mL ESCHERICHIA COLI  Clostridium Difficile by PCR (not at [facility])     Status: None   Collection Time: 04/28/15  6:03 AM  Result Value Ref Range Status   C difficile by pcr NEGATIVE NEGATIVE Final     Labs: Basic Metabolic Panel:  Recent Labs Lab 04/22/15 1929 04/26/15 1600  NA 147* 143  K 4.0 4.0  CL 111 105  CO2 29 32  GLUCOSE 137* 99  BUN 11 10  CREATININE 0.67 0.71  CALCIUM 9.5 9.3   Liver Function Tests:  Recent Labs Lab 04/22/15 1929 04/26/15 1600  AST 22 24  ALT 19 18  ALKPHOS 132* 144*  BILITOT 0.3 0.4  PROT 7.4 7.2  ALBUMIN 3.8 3.6    Recent Labs Lab 04/26/15 1600  LIPASE 12*   CBC:  Recent Labs Lab 04/22/15 1929 04/26/15 1600  WBC 5.1 5.7  NEUTROABS 2.8 3.0  HGB 12.6 13.1  HCT 38.4 39.2  MCV 85.7 86.0  PLT 161 155   Cardiac Enzymes:  Recent Labs Lab 04/26/15 1600  TROPONINI <0.03   BNP: BNP (last 3 results)  Recent Labs  01/09/15 1418  BNP 25.0   Signed:  Briel Gallicchio  [facility] 04/29/2015, 9:27 AM

## 2015-04-29 NOTE — Care Management Important Message (Signed)
Important Message  Patient Details  Name: Paul HalfRasheeda Rusconi MRN: [id number] Date of Birth: 04-Jun-1927   Medicare Important Message Given:  Yes-second notification given    Malcolm MetroChildress, Paarth Cropper Demske, RN 04/29/2015, 9:32 AM

## 2015-04-29 NOTE — Discharge Planning (Addendum)
Pt IV removed/ RN assessment and VS reveal stability for DC. Packet printed, including script and will be given to ALF upon arrival. Pt cleaned up, fed and dressed to leave. Family notified of patient heading to facility. Pt unable to sign DC papers due to not being oriented. Will be wheeled downstairs to be transported, by facility, back to ALF.

## 2015-04-29 NOTE — Clinical Social Work Note (Signed)
Pt d/c today back to [facility]. Pt's daughter notified by voicemail. Facility aware and agreeable and will pick up pt this afternoon.   Derenda FennelKara Vi Biddinger, LCSW [phone]

## 2015-04-29 NOTE — Care Management Note (Signed)
Case Management Note  Patient Details  Name: Michelle Escobar MRN: [id number] Date of Birth: 11-30-1926  Expected Discharge Date:                  Expected Discharge Plan:  Assisted Living / Rest Home  In-House Referral:  Clinical Social Work  Discharge planning Services  CM Consult  Post Acute Care Choice:  NA Choice offered to:  NA  DME Arranged:    DME Agency:     HH Arranged:    HH Agency:     Status of Service:  Completed, signed off  Medicare Important Message Given:  Yes-second notification given Date Medicare IM Given:    Medicare IM give by:    Date Additional Medicare IM Given:    Additional Medicare Important Message give by:     If discussed at Long Length of Stay Meetings, dates discussed:    Additional Comments: Pt being discharged today back to ALF today. CSW is aware and will arrange for return to facility. Pt's RN aware. No CM needs.  Malcolm Metrohildress, Autumn Gunn Demske, RN 04/29/2015, 9:33 AM

## 2015-07-16 DEATH — deceased

## 2016-01-04 IMAGING — CR DG CHEST 1V PORT
1 series · 1 of 1 positions shown · non-contrast
Comparison: 01/09/2015

CLINICAL DATA: Altered mental status

EXAM:
PORTABLE CHEST - 1 VIEW

[ap portable]
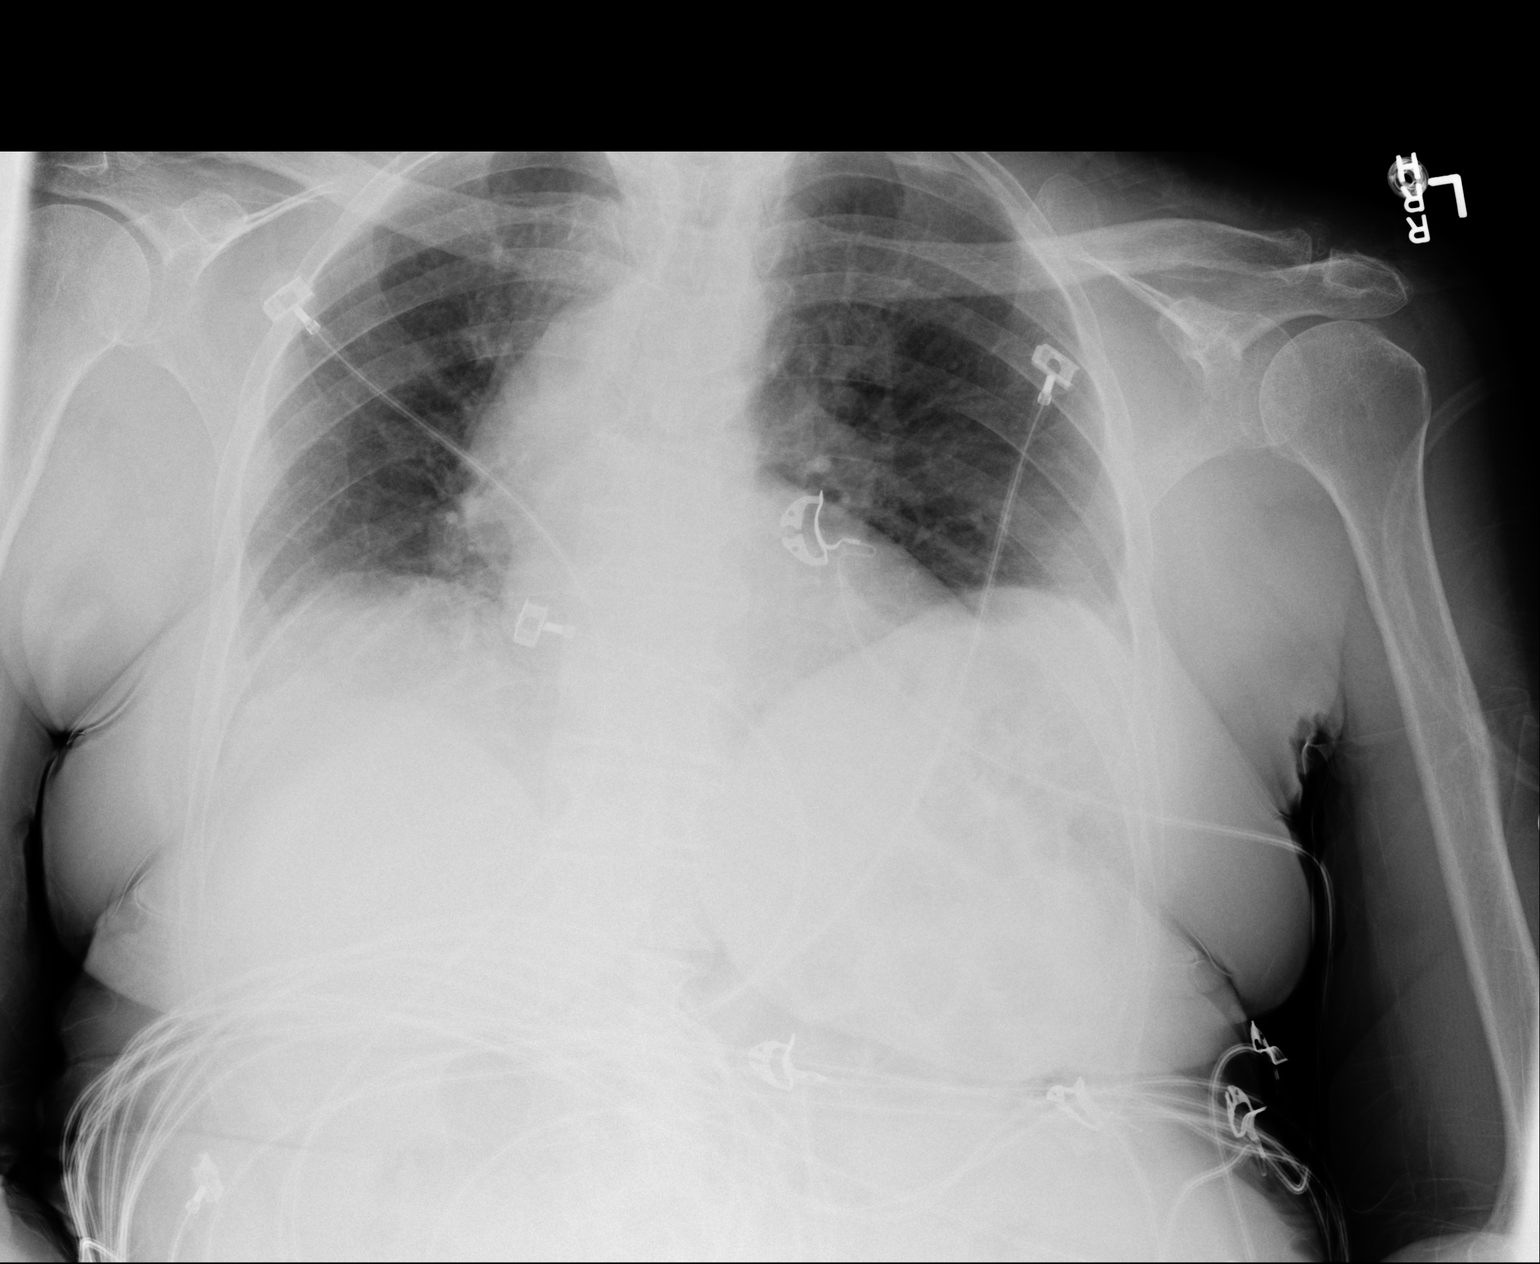

[1 of 1 positions shown; findings below may reference images not displayed]

FINDINGS: Cardiac shadow is stable. The lungs are poorly inflated although no
focal infiltrate or sizable effusion is seen. Tortuosity of the
thoracic aorta is noted. Degenerative changes of thoracic spine are
noted.
IMPRESSION: No active disease.

## 2016-02-24 IMAGING — CT CT HEAD W/O CM
2 series · 15 of 30 positions shown, 19 images · non-contrast
Comparison: CT of the head performed 01/09/2015

CLINICAL DATA: Acute onset of weakness and dizziness. Loss of
consciousness. Initial encounter.

EXAM:
CT HEAD WITHOUT CONTRAST
TECHNIQUE: Contiguous axial images were obtained from the base of the skull
through the vertex without intravenous contrast.

[Series 2: headseq 4.8 h37s · axial · 0.41mm/px · z∈[+154,+291]mm · 13 of 34 slices shown, 17 images]
[im 3/34  brain]
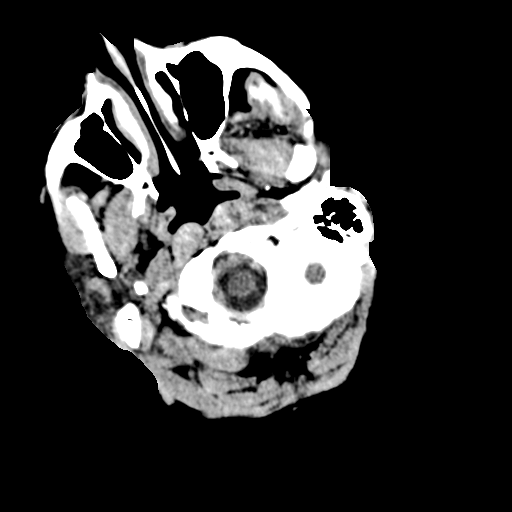
[im 3/34  bone]
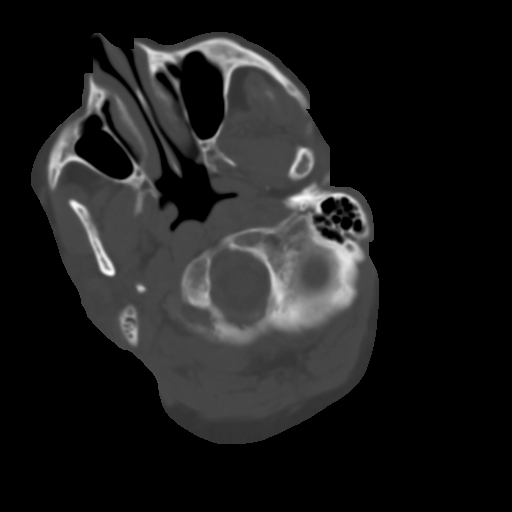
[im 5/34  brain]
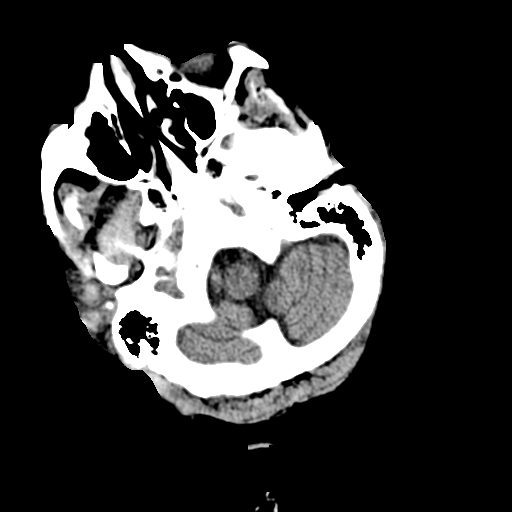
[im 8/34  brain]
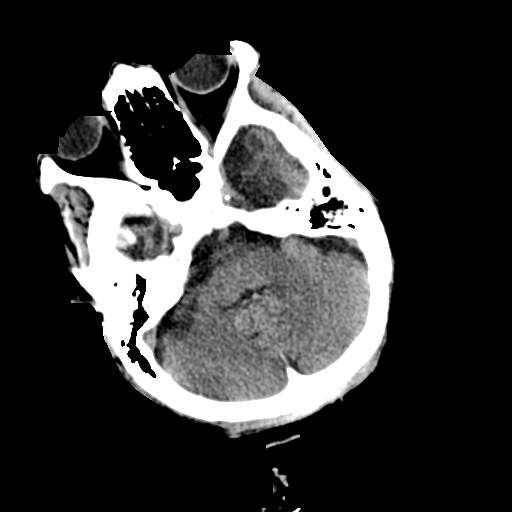
[im 10/34  brain]
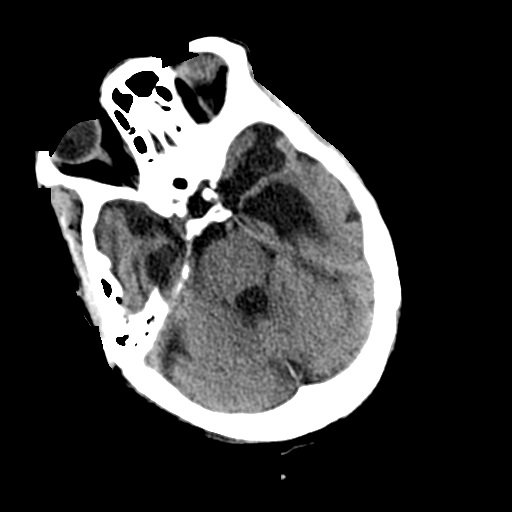
[im 12/34  brain]
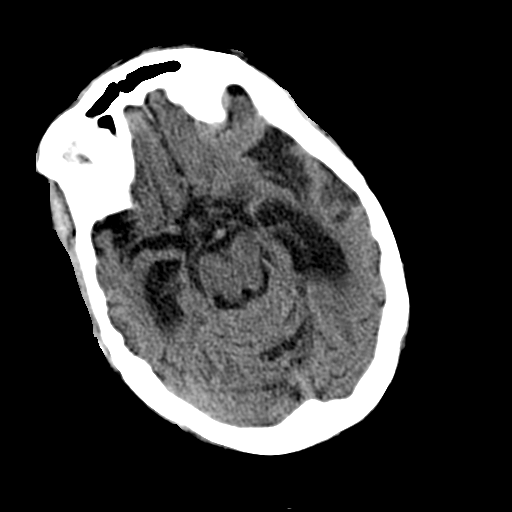
[im 12/34  bone]
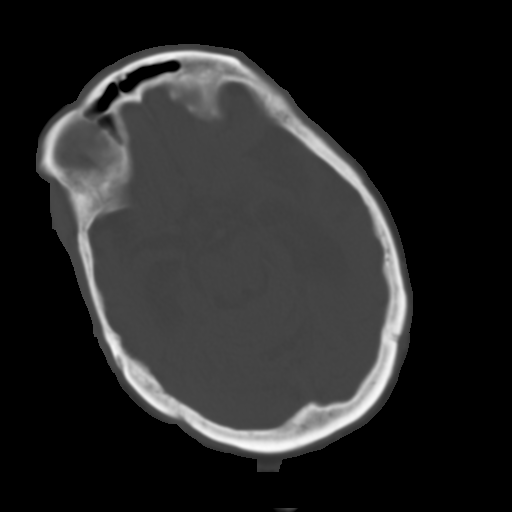
[im 15/34  brain]
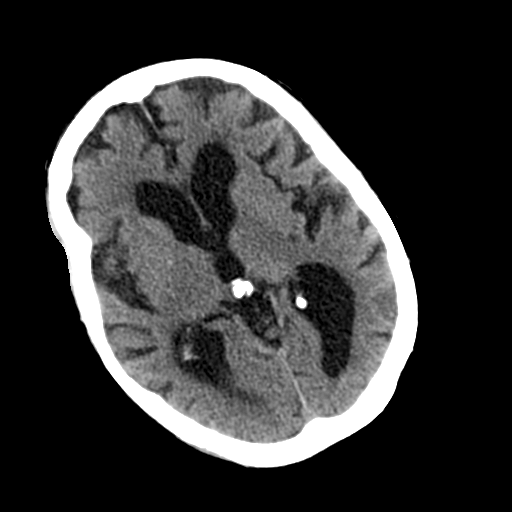
[im 17/34  brain]
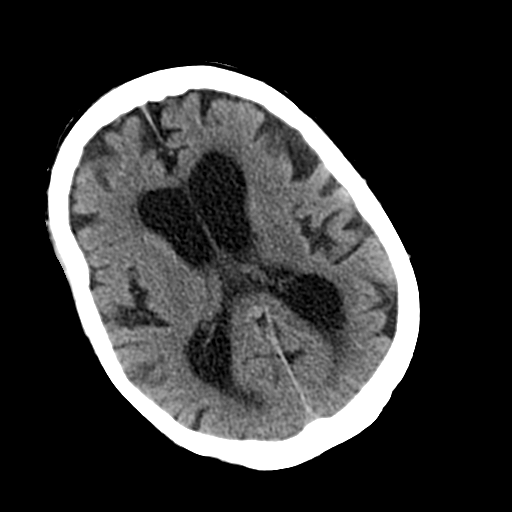
[im 19/34  brain]
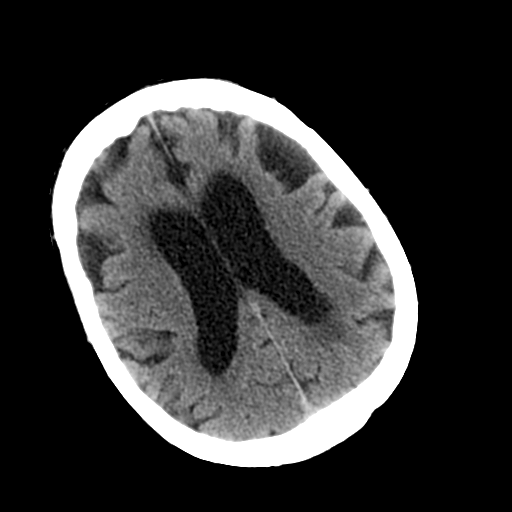
[im 22/34  brain]
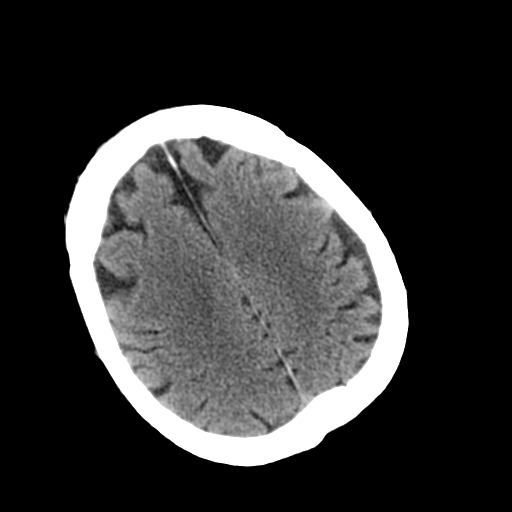
[im 22/34  bone]
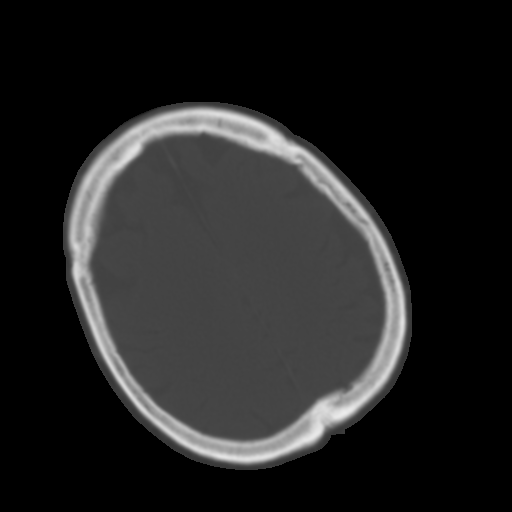
[im 24/34  brain]
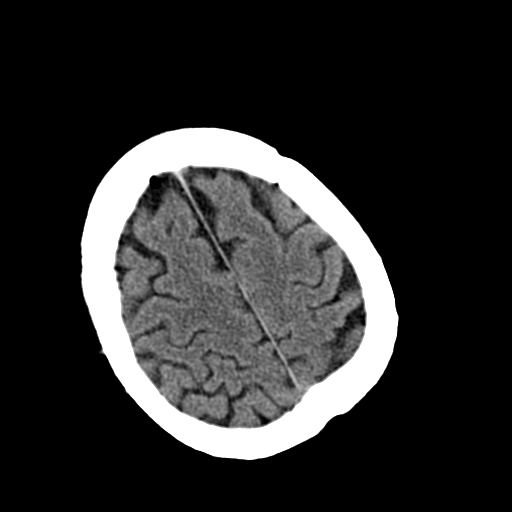
[im 26/34  brain]
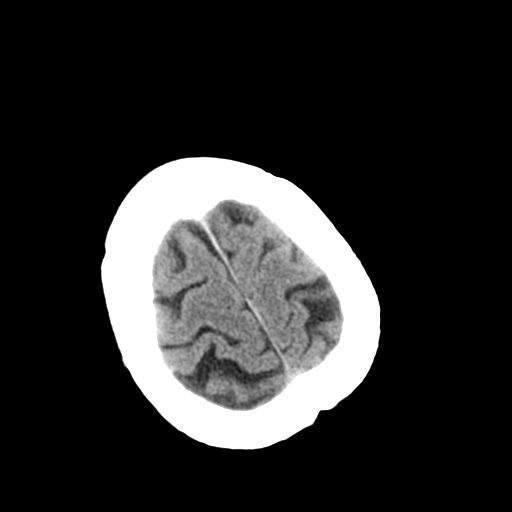
[im 29/34  brain]
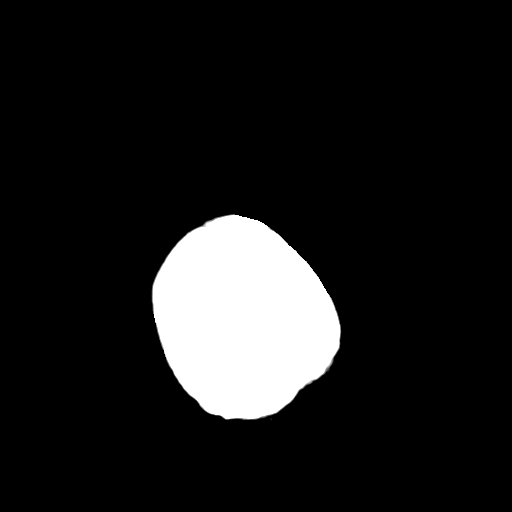
[im 31/34  brain]
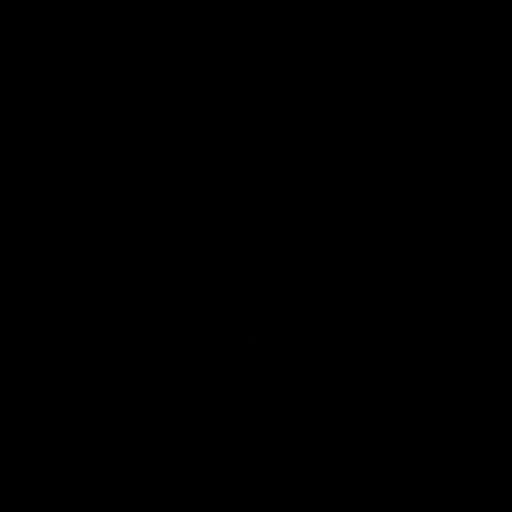
[im 31/34  bone]
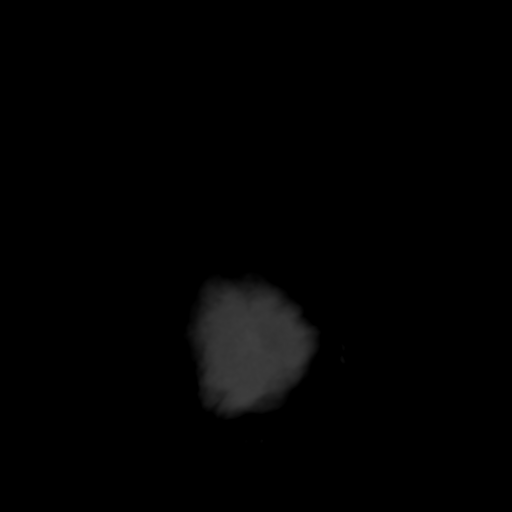

[Series 3: headseq 4.8 h60s · axial · 0.41mm/px · z∈[+154,+178]mm · 2 of 35 slices shown]
[im 3/35  brain]
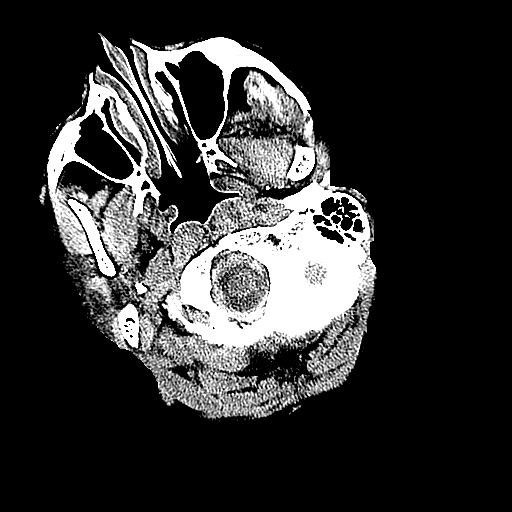
[im 8/35  brain]
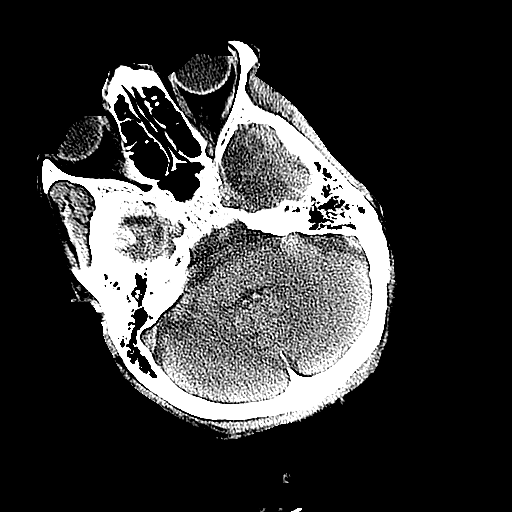

[15 of 30 positions shown; findings below may reference images not displayed]

FINDINGS: There is no evidence of acute infarction, mass lesion, or intra- or
extra-axial hemorrhage on CT.

Prominence of the ventricles and sulci reflects moderately severe
cortical volume loss. Cerebellar atrophy is noted. Scattered
periventricular white matter change likely reflects small vessel
ischemic microangiopathy.

The brainstem and fourth ventricle are within normal limits. The
basal ganglia are unremarkable in appearance. The cerebral
hemispheres demonstrate grossly normal gray-white differentiation.
No mass effect or midline shift is seen.

There is no evidence of fracture; visualized osseous structures are
unremarkable in appearance. The orbits are within normal limits. The
paranasal sinuses and mastoid air cells are well-aerated. No
significant soft tissue abnormalities are seen.
IMPRESSION: 1. No acute intracranial pathology seen on CT.
2. Moderately severe cortical volume loss and scattered small vessel
ischemic microangiopathy.
